# Patient Record
Sex: Female | Born: 1950
Health system: Southern US, Community
[De-identification: ages and names within clinical notes are randomized; demographics above are authoritative.]

## PROBLEM LIST (undated history)

## (undated) DIAGNOSIS — K635 Polyp of colon: Secondary | ICD-10-CM

## (undated) DIAGNOSIS — H269 Unspecified cataract: Secondary | ICD-10-CM

## (undated) DIAGNOSIS — M810 Age-related osteoporosis without current pathological fracture: Secondary | ICD-10-CM

## (undated) DIAGNOSIS — C801 Malignant (primary) neoplasm, unspecified: Secondary | ICD-10-CM

## (undated) DIAGNOSIS — J302 Other seasonal allergic rhinitis: Secondary | ICD-10-CM

## (undated) DIAGNOSIS — I10 Essential (primary) hypertension: Secondary | ICD-10-CM

## (undated) DIAGNOSIS — E785 Hyperlipidemia, unspecified: Secondary | ICD-10-CM

## (undated) HISTORY — DX: Malignant (primary) neoplasm, unspecified: C80.1

## (undated) HISTORY — DX: Hyperlipidemia, unspecified: E78.5

## (undated) HISTORY — DX: Essential (primary) hypertension: I10

## (undated) HISTORY — PX: BREAST BIOPSY: SHX20

## (undated) HISTORY — DX: Other seasonal allergic rhinitis: J30.2

## (undated) HISTORY — DX: Polyp of colon: K63.5

## (undated) HISTORY — DX: Unspecified cataract: H26.9

## (undated) HISTORY — DX: Age-related osteoporosis without current pathological fracture: M81.0

## (undated) HISTORY — PX: EYE SURGERY: SHX253

## (undated) HISTORY — PX: SKIN BIOPSY: SHX1

## (undated) HISTORY — PX: CATARACT EXTRACTION, BILATERAL: SHX1313

---

## 2013-07-21 ENCOUNTER — Encounter (HOSPITAL_COMMUNITY): Payer: Self-pay | Admitting: Emergency Medicine

## 2013-07-21 ENCOUNTER — Emergency Department (HOSPITAL_COMMUNITY)
Admission: EM | Admit: 2013-07-21 | Discharge: 2013-07-21 | Disposition: A | Discharge status: Home / Self Care | Payer: 59 | Source: Home / Self Care

## 2013-07-21 DIAGNOSIS — L255 Unspecified contact dermatitis due to plants, except food: Secondary | ICD-10-CM

## 2013-07-21 MED ORDER — TRIAMCINOLONE ACETONIDE 40 MG/ML IJ SUSP
INTRAMUSCULAR | Status: AC
  Filled 2013-07-21: qty 1

## 2013-07-21 MED ORDER — METHYLPREDNISOLONE 4 MG PO KIT: PACK | ORAL | Status: DC

## 2013-07-21 MED ORDER — TRIAMCINOLONE ACETONIDE 40 MG/ML IJ SUSP
40.0000 mg | Freq: Once | INTRAMUSCULAR | Status: AC
  Administered 2013-07-21: 40 mg via INTRAMUSCULAR

## 2013-07-21 MED ORDER — TRIAMCINOLONE ACETONIDE 0.1 % EX CREA: 1.0000 "application " | TOPICAL_CREAM | Freq: Two times a day (BID) | CUTANEOUS | Status: DC

## 2013-07-21 NOTE — ED Provider Notes (Signed)
CSN: [id number]     Arrival date & time 07/21/13  1735 History   First MD Initiated Contact with Patient 07/21/13 1828     Chief Complaint  Patient presents with  . Rash   (Consider location/radiation/quality/duration/timing/severity/associated sxs/prior Treatment) HPI Comments: As above, exposed to a poisonous plant most likely and now with red, itchy rash to the R arm and patchy areas to the L arm , abdomen and lesser to legs.    History reviewed. No pertinent past medical history. History reviewed. No pertinent past surgical history. No family history on file. History  Substance Use Topics  . Smoking status: Not on file  . Smokeless tobacco: Not on file  . Alcohol Use: Not on file   OB History   Grav Para Term Preterm Abortions TAB SAB Ect Mult Living                 Review of Systems  Constitutional: Negative for fever.  HENT: Negative.   Respiratory: Negative.  Negative for cough and shortness of breath.   Gastrointestinal: Negative.   Genitourinary: Negative.   Skin: Positive for rash.  Neurological: Negative.   All other systems reviewed and are negative.   Allergies  Review of patient's allergies indicates no known allergies.  Home Medications   Prior to Admission medications   Not on File   BP 161/83  Pulse 74  Temp(Src) 98.9 F (37.2 C) (Oral)  Resp 16  SpO2 96% Physical Exam  Nursing note and vitals reviewed. Constitutional: She is oriented to person, place, and time. She appears well-developed and well-nourished. No distress.  HENT:  Mouth/Throat: Oropharynx is clear and moist.  No rash to face  Eyes: Conjunctivae and EOM are normal.  Neck: Normal range of motion. Neck supple.  Cardiovascular: Normal rate.   Pulmonary/Chest: Effort normal. No respiratory distress.  Neurological: She is alert and oriented to person, place, and time. She exhibits normal muscle tone.  Skin: Skin is warm and dry. Rash noted.  Right arm with deep erythema,  papulovesicular lesions with minor swelling. A few patchy red lesions in the same character to the abdomen and other extremities.  Psychiatric: She has a normal mood and affect.    ED Course  Procedures (including critical care time) Labs Review Labs Reviewed - No data to display  Imaging Review No results found.   MDM   1. Rhus dermatitis      Kenalog 40 mg IM Medrol dose triamcinolone cr bid    Janne Napoleon, NP 07/21/13 1904

## 2013-07-21 NOTE — Discharge Instructions (Signed)
Contact Dermatitis Contact dermatitis is a reaction to certain substances that touch the skin. Contact dermatitis can be either irritant contact dermatitis or allergic contact dermatitis. Irritant contact dermatitis does not require previous exposure to the substance for a reaction to occur.Allergic contact dermatitis only occurs if you have been exposed to the substance before. Upon a repeat exposure, your body reacts to the substance.  CAUSES  Many substances can cause contact dermatitis. Irritant dermatitis is most commonly caused by repeated exposure to mildly irritating substances, such as:  Makeup.  Soaps.  Detergents.  Bleaches.  Acids.  Metal salts, such as nickel. Allergic contact dermatitis is most commonly caused by exposure to:  Poisonous plants.  Chemicals (deodorants, shampoos).  Jewelry.  Latex.  Neomycin in triple antibiotic cream.  Preservatives in products, including clothing. SYMPTOMS  The area of skin that is exposed may develop:  Dryness or flaking.  Redness.  Cracks.  Itching.  Pain or a burning sensation.  Blisters. With allergic contact dermatitis, there may also be swelling in areas such as the eyelids, mouth, or genitals.  DIAGNOSIS  Your caregiver can usually tell what the problem is by doing a physical exam. In cases where the cause is uncertain and an allergic contact dermatitis is suspected, a patch skin test may be performed to help determine the cause of your dermatitis. TREATMENT Treatment includes protecting the skin from further contact with the irritating substance by avoiding that substance if possible. Barrier creams, powders, and gloves may be helpful. Your caregiver may also recommend:  Steroid creams or ointments applied 2 times daily. For best results, soak the rash area in cool water for 20 minutes. Then apply the medicine. Cover the area with a plastic wrap. You can store the steroid cream in the refrigerator for a "chilly"  effect on your rash. That may decrease itching. Oral steroid medicines may be needed in more severe cases.  Antibiotics or antibacterial ointments if a skin infection is present.  Antihistamine lotion or an antihistamine taken by mouth to ease itching.  Lubricants to keep moisture in your skin.  Burow's solution to reduce redness and soreness or to dry a weeping rash. Mix one packet or tablet of solution in 2 cups cool water. Dip a clean washcloth in the mixture, wring it out a bit, and put it on the affected area. Leave the cloth in place for 30 minutes. Do this as often as possible throughout the day.  Taking several cornstarch or baking soda baths daily if the area is too large to cover with a washcloth. Harsh chemicals, such as alkalis or acids, can cause skin damage that is like a burn. You should flush your skin for 15 to 20 minutes with cold water after such an exposure. You should also seek immediate medical care after exposure. Bandages (dressings), antibiotics, and pain medicine may be needed for severely irritated skin.  HOME CARE INSTRUCTIONS  Avoid the substance that caused your reaction.  Keep the area of skin that is affected away from hot water, soap, sunlight, chemicals, acidic substances, or anything else that would irritate your skin.  Do not scratch the rash. Scratching may cause the rash to become infected.  You may take cool baths to help stop the itching.  Only take over-the-counter or prescription medicines as directed by your caregiver.  See your caregiver for follow-up care as directed to make sure your skin is healing properly. SEEK MEDICAL CARE IF:   Your condition is not better after 3  days of treatment.  You seem to be getting worse.  You see signs of infection such as swelling, tenderness, redness, soreness, or warmth in the affected area.  You have any problems related to your medicines. Document Released: 02/17/2000 Document Revised: 05/14/2011  Document Reviewed: 07/25/2010 [facility] Patient Information 2014 [place], [place].  Poison [employer] ivy is a inflammation of the skin (contact dermatitis) caused by touching the allergens on the leaves of the ivy plant following previous exposure to the plant. The rash usually appears 48 hours after exposure. The rash is usually bumps (papules) or blisters (vesicles) in a linear pattern. Depending on your own sensitivity, the rash may simply cause redness and itching, or it may also progress to blisters which may break open. These must be well cared for to prevent secondary bacterial (germ) infection, followed by scarring. Keep any open areas dry, clean, dressed, and covered with an antibacterial ointment if needed. The eyes may also get puffy. The puffiness is worst in the morning and gets better as the day progresses. This dermatitis usually heals without scarring, within 2 to 3 weeks without treatment. HOME CARE INSTRUCTIONS  Thoroughly wash with soap and water as soon as you have been exposed to poison ivy. You have about one half hour to remove the plant resin before it will cause the rash. This washing will destroy the oil or antigen on the skin that is causing, or will cause, the rash. Be sure to wash under your fingernails as any plant resin there will continue to spread the rash. Do not rub skin vigorously when washing affected area. Poison ivy cannot spread if no oil from the plant remains on your body. A rash that has progressed to weeping sores will not spread the rash unless you have not washed thoroughly. It is also important to wash any clothes you have been wearing as these may carry active allergens. The rash will return if you wear the unwashed clothing, even several days later. Avoidance of the plant in the future is the best measure. Poison ivy plant can be recognized by the number of leaves. Generally, poison ivy has three leaves with flowering branches on a single stem. Diphenhydramine  may be purchased over the counter and used as needed for itching. Do not drive with this medication if it makes you drowsy.Ask your caregiver about medication for children. SEEK MEDICAL CARE IF:  Open sores develop.  Redness spreads beyond area of rash.  You notice purulent (pus-like) discharge.  You have increased pain.  Other signs of infection develop (such as fever). Document Released: 02/17/2000 Document Revised: 05/14/2011 Document Reviewed: 01/05/2009 [facility] Patient Information 2014 [place], [place].

## 2013-07-21 NOTE — ED Notes (Signed)
Patient states was clearing out some brush last weekend A couple days later she started with a rash to her arm and multiple areas Areas have become red, itchy and inflamed especially her right arm

## 2013-07-23 NOTE — ED Provider Notes (Signed)
Medical screening examination/treatment/procedure(s) were performed by non-physician practitioner and as supervising physician I was immediately available for consultation/collaboration.  Philipp Deputy, M.D.  Harden Mo, MD 07/23/13 [phone]

## 2017-12-25 ENCOUNTER — Ambulatory Visit (INDEPENDENT_AMBULATORY_CARE_PROVIDER_SITE_OTHER): Payer: 59 | Admitting: Family Medicine

## 2017-12-25 ENCOUNTER — Encounter: Payer: Self-pay | Admitting: Family Medicine

## 2017-12-25 VITALS — BP 160/100 | HR 78 | Temp 97.8°F | Ht 67.0 in | Wt 142.0 lb

## 2017-12-25 DIAGNOSIS — Z8572 Personal history of non-Hodgkin lymphomas: Secondary | ICD-10-CM | POA: Insufficient documentation

## 2017-12-25 DIAGNOSIS — L720 Epidermal cyst: Secondary | ICD-10-CM

## 2017-12-25 DIAGNOSIS — I10 Essential (primary) hypertension: Secondary | ICD-10-CM

## 2017-12-25 DIAGNOSIS — Z0000 Encounter for general adult medical examination without abnormal findings: Secondary | ICD-10-CM | POA: Diagnosis not present

## 2017-12-25 DIAGNOSIS — C84A0 Cutaneous T-cell lymphoma, unspecified, unspecified site: Secondary | ICD-10-CM | POA: Insufficient documentation

## 2017-12-25 LAB — COMPREHENSIVE METABOLIC PANEL
ALK PHOS: 43 U/L (ref 39–117)
ALT: 11 U/L (ref 0–35)
AST: 16 U/L (ref 0–37)
Albumin: 4.8 g/dL (ref 3.5–5.2)
BUN: 8 mg/dL (ref 6–23)
CO2: 28 mEq/L (ref 19–32)
Calcium: 9.3 mg/dL (ref 8.4–10.5)
Chloride: 103 mEq/L (ref 96–112)
Creatinine, Ser: 0.7 mg/dL (ref 0.40–1.20)
GFR: 88.22 mL/min (ref >60.00)
Glucose, Bld: 105 mg/dL — ABNORMAL HIGH (ref 70–99)
POTASSIUM: 3.8 meq/L (ref 3.5–5.1)
SODIUM: 140 meq/L (ref 135–145)
TOTAL PROTEIN: 7.7 g/dL (ref 6.0–8.3)
Total Bilirubin: 0.5 mg/dL (ref 0.2–1.2)

## 2017-12-25 LAB — CBC WITH DIFFERENTIAL/PLATELET
BASOS PCT: 0.5 % (ref 0.0–3.0)
Basophils Absolute: 0 10*3/uL (ref 0.0–0.1)
Eosinophils Absolute: 0 10*3/uL (ref 0.0–0.7)
Eosinophils Relative: 0.2 % (ref 0.0–5.0)
HCT: 45.3 % (ref 36.0–46.0)
Hemoglobin: 15 g/dL (ref 12.0–15.0)
LYMPHS ABS: 1.2 10*3/uL (ref 0.7–4.0)
Lymphocytes Relative: 23.2 % (ref 12.0–46.0)
MCHC: 33.2 g/dL (ref 30.0–36.0)
MCV: 94.1 fl (ref 78.0–100.0)
MONO ABS: 0.3 10*3/uL (ref 0.1–1.0)
Monocytes Relative: 5.4 % (ref 3.0–12.0)
NEUTROS PCT: 70.7 % (ref 43.0–77.0)
Neutro Abs: 3.8 10*3/uL (ref 1.4–7.7)
Platelets: 324 10*3/uL (ref 150.0–400.0)
RBC: 4.82 Mil/uL (ref 3.87–5.11)
RDW: 13.9 % (ref 11.5–15.5)
WBC: 5.3 10*3/uL (ref 4.0–10.5)

## 2017-12-25 LAB — MICROALBUMIN / CREATININE URINE RATIO
Creatinine,U: 21.8 mg/dL
Microalb Creat Ratio: 3.2 mg/g (ref 0.0–30.0)

## 2017-12-25 LAB — LIPID PANEL
Cholesterol: 230 mg/dL — ABNORMAL HIGH (ref 0–200)
HDL: 65.8 mg/dL (ref >39.00)
LDL Cholesterol: 139 mg/dL — ABNORMAL HIGH (ref 0–99)
NONHDL: 163.79
Total CHOL/HDL Ratio: 3
Triglycerides: 122 mg/dL (ref 0.0–149.0)
VLDL: 24.4 mg/dL (ref 0.0–40.0)

## 2017-12-25 LAB — TSH: TSH: 1.51 u[IU]/mL (ref 0.35–4.50)

## 2017-12-25 MED ORDER — LISINOPRIL 20 MG PO TABS: 20.0000 mg | ORAL_TABLET | Freq: Every day | ORAL | 0 refills | Status: DC

## 2017-12-25 NOTE — Patient Instructions (Addendum)
Starting you on lisinopril 20mg  daily for your BP. See me back in one month.   Epidermal Cyst An epidermal cyst is a small, painless lump under your skin. It may be called an epidermal inclusion cyst or an infundibular cyst. The cyst contains a grayish-white, bad-smelling substance (keratin). It is important not to pop epidermal cysts yourself. These cysts are usually harmless (benign), but they can get infected. Symptoms of infection may include:  Redness.  Inflammation.  Tenderness.  Warmth.  Fever.  A grayish-white, bad-smelling substance draining from the cyst.  Pus draining from the cyst.  Follow these instructions at home:  Take over-the-counter and prescription medicines only as told by your doctor.  If you were prescribed an antibiotic, use it as told by your doctor. Do not stop using the antibiotic even if you start to feel better.  Keep the area around your cyst clean and dry.  Wear loose, dry clothing.  Do not try to pop your cyst.  Avoid touching your cyst.  Check your cyst every day for signs of infection.  Keep all follow-up visits as told by your doctor. This is important. How is this prevented?  Wear clean, dry, clothing.  Avoid wearing tight clothing.  Keep your skin clean and dry. Shower or take baths every day.  Wash your body with a benzoyl peroxide wash when you shower or bathe. Contact a health care provider if:  Your cyst has symptoms of infection.  Your condition is not improving or is getting worse.  You have a cyst that looks different from other cysts you have had.  You have a fever. Get help right away if:  Redness spreads from the cyst into the surrounding area. This information is not intended to replace advice given to you by your health care provider. Make sure you discuss any questions you have with your health care provider. Document Released: 03/29/2004 Document Revised: 10/19/2015 Document Reviewed: 12/22/2014 Elsevier  Interactive Patient Education  2018 [employer]. Hypertension Hypertension is another name for high blood pressure. High blood pressure forces your heart to work harder to pump blood. This can cause problems over time. There are two numbers in a blood pressure reading. There is a top number (systolic) over a bottom number (diastolic). It is best to have a blood pressure below 120/80. Healthy choices can help lower your blood pressure. You may need medicine to help lower your blood pressure if:  Your blood pressure cannot be lowered with healthy choices.  Your blood pressure is higher than 130/80.  Follow these instructions at home: Eating and drinking  If directed, follow the DASH eating plan. This diet includes: ? Filling half of your plate at each meal with fruits and vegetables. ? Filling one quarter of your plate at each meal with whole grains. Whole grains include whole wheat pasta, brown rice, and whole grain bread. ? Eating or drinking low-fat dairy products, such as skim milk or low-fat yogurt. ? Filling one quarter of your plate at each meal with low-fat (lean) proteins. Low-fat proteins include fish, skinless chicken, eggs, beans, and tofu. ? Avoiding fatty meat, cured and processed meat, or chicken with skin. ? Avoiding premade or processed food.  Eat less than 1,500 mg of salt (sodium) a day.  Limit alcohol use to no more than 1 drink a day for nonpregnant women and 2 drinks a day for men. One drink equals 12 oz of beer, 5 oz of wine, or 1 oz of hard liquor. Lifestyle  Work with your doctor to stay at a healthy weight or to lose weight. Ask your doctor what the best weight is for you.  Get at least 30 minutes of exercise that causes your heart to beat faster (aerobic exercise) most days of the week. This may include walking, swimming, or biking.  Get at least 30 minutes of exercise that strengthens your muscles (resistance exercise) at least 3 days a week. This may include  lifting weights or pilates.  Do not use any products that contain nicotine or tobacco. This includes cigarettes and e-cigarettes. If you need help quitting, ask your doctor.  Check your blood pressure at home as told by your doctor.  Keep all follow-up visits as told by your doctor. This is important. Medicines  Take over-the-counter and prescription medicines only as told by your doctor. Follow directions carefully.  Do not skip doses of blood pressure medicine. The medicine does not work as well if you skip doses. Skipping doses also puts you at risk for problems.  Ask your doctor about side effects or reactions to medicines that you should watch for. Contact a doctor if:  You think you are having a reaction to the medicine you are taking.  You have headaches that keep coming back (recurring).  You feel dizzy.  You have swelling in your ankles.  You have trouble with your vision. Get help right away if:  You get a very bad headache.  You start to feel confused.  You feel weak or numb.  You feel faint.  You get very bad pain in your: ? Chest. ? Belly (abdomen).  You throw up (vomit) more than once.  You have trouble breathing. Summary  Hypertension is another name for high blood pressure.  Making healthy choices can help lower blood pressure. If your blood pressure cannot be controlled with healthy choices, you may need to take medicine. This information is not intended to replace advice given to you by your health care provider. Make sure you discuss any questions you have with your health care provider. Document Released: 08/08/2007 Document Revised: 01/18/2016 Document Reviewed: 01/18/2016 Elsevier Interactive Patient Education  [employer].

## 2017-12-25 NOTE — Progress Notes (Signed)
Patient: Elizabeth Vincent MRN: [id number] DOB: [birth year] PCP: Orma Flaming, MD     Subjective:  Chief Complaint  Patient presents with  . Establish Care  . lump in left breast    HPI: The patient is a 67 y.o. female who presents today for annual exam. She denies any changes to past medical history. There have been no recent hospitalizations. They are following a well balanced diet and exercise plan. Weight has been stable.  She has concerns for a lump in her breast. Her mother had ovarian cancer.   Lump in her left breast: she is unsure when she first noticed it. Lump in on the medial-superior aspect of her left breast. It is not tender to touch and she thinks it has grown in size. No nipple issues.   Immunization History  Administered Date(s) Administered  . Influenza-Unspecified 11/14/2017   Colonoscopy: never had one  Mammogram: 2000 Pap smear: las one before 2000 Tdap: none  pnuemonia shots: none   Review of Systems  Constitutional: Negative for chills, fatigue and fever.  HENT: Negative for dental problem, ear pain, hearing loss and trouble swallowing.   Eyes: Negative for visual disturbance.  Respiratory: Negative for cough, chest tightness and shortness of breath.   Cardiovascular: Negative for chest pain, palpitations and leg swelling.  Gastrointestinal: Negative for abdominal pain, blood in stool, constipation, diarrhea and nausea.  Endocrine: Negative for cold intolerance, polydipsia, polyphagia and polyuria.  Genitourinary: Negative for dysuria and hematuria.  Musculoskeletal: Negative for arthralgias, back pain and neck pain.  Skin: Positive for color change. Negative for rash.       Mole on left lower leg Lump on left breast   Neurological: Negative for dizziness and headaches.  Psychiatric/Behavioral: Negative for dysphoric mood and sleep disturbance. The patient is nervous/anxious.     Allergies Patient has No Known Allergies.  Past Medical  History Patient  has a past medical history of Cancer ([place]).  Surgical History Patient  has a past surgical history that includes Eye surgery; Cataract extraction, bilateral; Skin biopsy; and Breast biopsy.  Family History Pateint's family history includes Alcohol abuse in her sister; Cancer in her maternal grandfather, maternal grandmother, mother, and paternal grandfather; Early death in her mother; Heart disease in her father and paternal grandmother; Stroke in her paternal grandfather.  Social History Patient  reports that she has never smoked. She has never used smokeless tobacco. She reports that she drinks alcohol. She reports that she does not use drugs.    Objective: Vitals:   12/25/17 1041 12/25/17 1051 12/25/17 1123  BP: (!) 146/98 (!) 154/102 (!) 160/100  Pulse: 78    Temp: 97.8 F (36.6 C)    TempSrc: Oral    SpO2: 99%    Weight: 142 lb (64.4 kg)    Height: 5\' 7"  (1.702 m)      Body mass index is 22.24 kg/m.  Physical Exam  Constitutional: She is oriented to person, place, and time. She appears well-developed and well-nourished.  HENT:  Right Ear: External ear normal.  Left Ear: External ear normal.  Tm occluded with cerumen bilaterally. Cobblestoning on posterior pharynx   Neck: Normal range of motion. Neck supple. No thyromegaly present.  Cardiovascular: Normal rate, regular rhythm and normal heart sounds.  No carotid bruits bilaterally   Pulmonary/Chest: Effort normal and breath sounds normal.  Abdominal: Soft. Bowel sounds are normal.  Lymphadenopathy:    She has no cervical adenopathy.  Neurological: She is alert and oriented to person,  place, and time.  Skin:  Epidermoid cyst on left inner,upper breast. About 1.5cm in diameter. Flesh colored with dimpled center that is black.   Psychiatric: She has a normal mood and affect. Her behavior is normal.  Vitals reviewed.  Depression screen [facility] 2/9 12/25/2017  Decreased Interest 0  Down, Depressed,  Hopeless 0  PHQ - 2 Score 0  Altered sleeping 0  Tired, decreased energy 0  Change in appetite 0  Feeling bad or failure about yourself  0  Trouble concentrating 0  Moving slowly or fidgety/restless 0  Suicidal thoughts 0  PHQ-9 Score 0  Difficult doing work/chores Not difficult at all   GAD 7 : Generalized Anxiety Score 12/25/2017  Nervous, Anxious, on Edge 1  Control/stop worrying 0  Worry too much - different things 0  Trouble relaxing 0  Restless 0  Easily annoyed or irritable 0  Afraid - awful might happen 1  Total GAD 7 Score 2  Anxiety Difficulty Not difficult at all   Verbal consent obtained. 25G needle used to open cyst and copious amounts of foul smelling, thick, white colored debris expressed. No bleeding and patient tolerated well.      Assessment/plan: 1. Annual physical exam Has not had medical care in awhile. Routine lab work today. Needs mmg and this information was given to her as well. Discussed immunizations and she will be willing to get her tdap and pcv 13 at next visit. Health maintenance issues discussed. Will see her back to continue to work on this in one month.  Patient counseling [x]    Nutrition: Stressed importance of moderation in sodium/caffeine intake, saturated fat and cholesterol, caloric balance, sufficient intake of fresh fruits, vegetables, fiber, calcium, iron, and 1 mg of folate supplement per day (for females capable of pregnancy).  [x]    Stressed the importance of regular exercise.   []    Substance Abuse: Discussed cessation/primary prevention of tobacco, alcohol, or other drug use; driving or other dangerous activities under the influence; availability of treatment for abuse.   [x]    Injury prevention: Discussed safety belts, safety helmets, smoke detector, smoking near bedding or upholstery.   [x]    Sexuality: Discussed sexually transmitted diseases, partner selection, use of condoms, avoidance of unintended pregnancy  and contraceptive  alternatives.  [x]    Dental health: Discussed importance of regular tooth brushing, flossing, and dental visits.  [x]    Health maintenance and immunizations reviewed. Please refer to Health maintenance section.    - Comprehensive metabolic panel - CBC with Differential/Platelet - Lipid panel - TSH  2. Essential hypertension Extremely elevated. Starting her on lisinopril 20mg  daily. Side effects of ACE-I discussed including dry cough and angioedema. She is to call me if feels like they have a dry cough and they are to call 911 or go to ER if any signs/symptoms of angioedema. Routine lab work today. Will need ekg at next visit for baseline. F/u in one month with me. Side effects of uncontrolled htn discussed including chf, afib, MI, CVA, vision issues to name a few.   - Microalbumin / creatinine urine ratio  3. Epidermoid cyst Expressed today. Discussed it will come back. Eradication is surgical excision of the sac. Would send to surgery to do this. She declines and is happy with just leaving it as long as she knows it's an epidermoid cyst. Did discuss they can get infected and gave her precautions for this. She is to let me know if she would like this removed.  Return in about 1 month (around 01/25/2018) for blood pressure .     Orma Flaming, MD [place]  12/25/2017

## 2017-12-26 ENCOUNTER — Encounter: Payer: Self-pay | Admitting: Family Medicine

## 2017-12-26 DIAGNOSIS — I10 Essential (primary) hypertension: Secondary | ICD-10-CM | POA: Insufficient documentation

## 2017-12-26 DIAGNOSIS — E785 Hyperlipidemia, unspecified: Secondary | ICD-10-CM | POA: Insufficient documentation

## 2018-01-29 ENCOUNTER — Encounter: Payer: Self-pay | Admitting: Family Medicine

## 2018-01-29 ENCOUNTER — Ambulatory Visit: Payer: 59 | Admitting: Family Medicine

## 2018-01-29 VITALS — BP 132/88 | HR 80 | Temp 98.1°F | Ht 67.0 in | Wt 140.4 lb

## 2018-01-29 DIAGNOSIS — E782 Mixed hyperlipidemia: Secondary | ICD-10-CM | POA: Diagnosis not present

## 2018-01-29 DIAGNOSIS — Z23 Encounter for immunization: Secondary | ICD-10-CM

## 2018-01-29 DIAGNOSIS — Z1159 Encounter for screening for other viral diseases: Secondary | ICD-10-CM | POA: Diagnosis not present

## 2018-01-29 DIAGNOSIS — I10 Essential (primary) hypertension: Secondary | ICD-10-CM

## 2018-01-29 LAB — LIPID PANEL
CHOLESTEROL: 239 mg/dL — AB (ref 0–200)
HDL: 72.9 mg/dL (ref >39.00)
LDL Cholesterol: 153 mg/dL — ABNORMAL HIGH (ref 0–99)
NonHDL: 166.38
Total CHOL/HDL Ratio: 3
Triglycerides: 65 mg/dL (ref 0.0–149.0)
VLDL: 13 mg/dL (ref 0.0–40.0)

## 2018-01-29 MED ORDER — LISINOPRIL 40 MG PO TABS: 40.0000 mg | ORAL_TABLET | Freq: Every day | ORAL | 3 refills | Status: DC

## 2018-01-29 NOTE — Patient Instructions (Signed)
Increasing your lisinopril to 40mg /day. I sent in a new prescription for you to take, but you can double up on your 20mg  pills. See you back in one month.   Checking fasting cholesterol today. If risk factor still quite elevated, would recommend we start medication.

## 2018-01-29 NOTE — Progress Notes (Signed)
Patient: Elizabeth Vincent MRN: [id number] DOB: March 07, 1950 PCP: Orma Flaming, MD     Subjective:  Chief Complaint  Patient presents with  . Hypertension    follow up    HPI: The patient is a 67 y.o. female who presents today for follow up on hypertension.   Hypertension: Here for follow up of hypertension.  Currently on lisinopril 20mg  . Home readings range from: does not take home reading/log. Takes medication as prescribed and denies any side effects. Exercise includes none. Weight has been stable. Denies any chest pain, headaches, shortness of breath, vision changes, swelling in lower extremities. Denies any side effects of medication.   Hyperlipidemia: her ASCVD risk factor is 15.6%. Discussing today.   Review of Systems  Constitutional: Negative for fatigue.  Respiratory: Negative for shortness of breath.   Cardiovascular: Negative for chest pain.  Gastrointestinal: Negative for abdominal pain and nausea.  Skin: Negative.   Neurological: Negative for dizziness and headaches.  Psychiatric/Behavioral: Negative for dysphoric mood and sleep disturbance. The patient is not nervous/anxious.     Allergies Patient has No Known Allergies.  Past Medical History Patient  has a past medical history of Cancer ([place]).  Surgical History Patient  has a past surgical history that includes Eye surgery; Cataract extraction, bilateral; Skin biopsy; and Breast biopsy.  Family History Pateint's family history includes Alcohol abuse in her sister; Cancer in her maternal grandfather, maternal grandmother, mother, and paternal grandfather; Early death in her mother; Heart disease in her father and paternal grandmother; Stroke in her paternal grandfather.  Social History Patient  reports that she has never smoked. She has never used smokeless tobacco. She reports that she drinks alcohol. She reports that she does not use drugs.    Objective: Vitals:   01/29/18 0912 01/29/18 0921  BP: (!)  136/92 132/88  Pulse: 80   Temp: 98.1 F (36.7 C)   TempSrc: Oral   SpO2: 98%   Weight: 140 lb 6.4 oz (63.7 kg)   Height: 5\' 7"  (1.702 m)     Body mass index is 21.99 kg/m. Repeat: 168/100  Physical Exam  Constitutional: She is oriented to person, place, and time. She appears well-developed and well-nourished.  Neck: Normal range of motion. Neck supple.  Cardiovascular: Normal rate, regular rhythm and normal heart sounds.  Pulmonary/Chest: Effort normal and breath sounds normal.  Abdominal: Soft. Bowel sounds are normal.  Neurological: She is alert and oriented to person, place, and time.  Skin: Skin is warm and dry.  Psychiatric: She has a normal mood and affect. Her behavior is normal.  Vitals reviewed.  Ekg: nsr with rate of 81    Assessment/plan: 1. Essential hypertension Above goal. Increasing her lisinopril to 40mg  daily. Asked that she get a cuff and start a home log with me as well. Really work on exercise. Her diet is good and weight is close to goal. Will see her back in one month for recheck with home log. Baseline EKG today wnl.  - EKG 12-Lead  2. Need for Tdap vaccination  - Tdap vaccine greater than or equal to 7yo IM  3. Mixed hyperlipidemia ASCVD risk is 15.6%. She was not fasting, so we are going to check again today as she is fasting and see if this changes her risk factor at all. If still elevated, we will start medication. She seems on board with this.  - Lipid panel  4. Encounter for hepatitis C screening test for low risk patient  - Hepatitis C  antibody   Return in about 1 month (around 02/28/2018) for blood pressure .   Orma Flaming, MD [facility]   01/29/2018

## 2018-01-31 LAB — HEPATITIS C ANTIBODY
HEP C AB: NONREACTIVE
SIGNAL TO CUT-OFF: 0.01 (ref <1.00)

## 2018-02-28 ENCOUNTER — Encounter: Payer: Self-pay | Admitting: Family Medicine

## 2018-02-28 ENCOUNTER — Ambulatory Visit: Payer: 59 | Admitting: Family Medicine

## 2018-02-28 VITALS — BP 148/80 | HR 68 | Temp 98.0°F | Ht 67.0 in | Wt 140.0 lb

## 2018-02-28 DIAGNOSIS — I10 Essential (primary) hypertension: Secondary | ICD-10-CM | POA: Diagnosis not present

## 2018-02-28 DIAGNOSIS — E782 Mixed hyperlipidemia: Secondary | ICD-10-CM | POA: Diagnosis not present

## 2018-02-28 NOTE — Progress Notes (Signed)
Patient: Elizabeth Vincent MRN: [id number] DOB: [birth year] PCP: Orma Flaming, MD     Subjective:  Chief Complaint  Patient presents with  . Hypertension    HPI: The patient is a 67 y.o. female who presents today for follow up on hypertension.   Hypertension: Here for follow up of hypertension.  Currently on lisinopril 40mg   We increased her dosage on her last visit. She does not take her blood pressure at home.  Takes medication as prescribed and denies any side effects. Exercise includes nothing. Weight has been stable. Denies any chest pain, headaches, shortness of breath, vision changes, swelling in lower extremities.    Review of Systems  Constitutional: Negative for fatigue.  Respiratory: Negative for shortness of breath.   Cardiovascular: Negative for chest pain.  Gastrointestinal: Negative for abdominal pain and nausea.  Musculoskeletal: Negative for back pain and neck pain.  Neurological: Negative for dizziness and headaches.    Allergies Patient has No Known Allergies.  Past Medical History Patient  has a past medical history of Cancer ([place]).  Surgical History Patient  has a past surgical history that includes Eye surgery; Cataract extraction, bilateral; Skin biopsy; and Breast biopsy.  Family History Pateint's family history includes Alcohol abuse in her sister; Cancer in her maternal grandfather, maternal grandmother, mother, and paternal grandfather; Early death in her mother; Heart disease in her father and paternal grandmother; Stroke in her paternal grandfather.  Social History Patient  reports that she has never smoked. She has never used smokeless tobacco. She reports current alcohol use. She reports that she does not use drugs.    Objective: Vitals:   02/28/18 0758 02/28/18 0803 02/28/18 0818  BP: (!) 136/100 (!) 134/98 (!) 148/80  Pulse: 68    Temp: 98 F (36.7 C)    TempSrc: Oral    SpO2: 98%    Weight: 140 lb (63.5 kg)    Height: 5\' 7"  (1.702  m)      Body mass index is 21.93 kg/m.  Physical Exam Vitals signs reviewed.  Constitutional:      Appearance: Normal appearance. She is normal weight.  Neck:     Musculoskeletal: Normal range of motion and neck supple.  Cardiovascular:     Rate and Rhythm: Normal rate and regular rhythm.     Heart sounds: Normal heart sounds.  Pulmonary:     Effort: Pulmonary effort is normal.     Breath sounds: Normal breath sounds.  Abdominal:     General: Abdomen is flat. Bowel sounds are normal.     Palpations: Abdomen is soft.  Neurological:     General: No focal deficit present.     Mental Status: She is alert and oriented to person, place, and time.        Assessment/plan: 1. Essential hypertension She has not taken her medication today.  Will not adjust as she likely will be to goal with her BP as it's not too above goal today on repeat by myself. Will have her come back for nurse visit to check BP after she takes medication and then see me in 6 months for routine f/u and labs.   2. Mixed hyperlipidemia She declines any medication at this time. ASCVD risk is 15.6% and she understands risks. Will continue with healthy diet/exercise and blood pressure control.   -declines pneumonia shot. Referred for mammogram and has not gotten done.    Return in about 6 months (around 08/30/2018) for nurse visit in 2 weeks. take med! see  me in 6 months .    Orma Flaming, MD [place]   02/28/2018

## 2018-03-12 ENCOUNTER — Ambulatory Visit (INDEPENDENT_AMBULATORY_CARE_PROVIDER_SITE_OTHER): Payer: Managed Care, Other (non HMO)

## 2018-03-12 VITALS — BP 130/86 | HR 56

## 2018-03-12 DIAGNOSIS — I10 Essential (primary) hypertension: Secondary | ICD-10-CM | POA: Diagnosis not present

## 2018-03-12 NOTE — Progress Notes (Signed)
Blood pressure to goal. F/u in 6 months for regular appointment.

## 2018-03-12 NOTE — Progress Notes (Signed)
Pt here for bp check today.  She has not been monitoring her bp at home-but denies any chest pain, SOB, dizziness or headaches and feels fine otherwise.  She is currently taking 40 mg of Lisinopril daily.  She is tolerating the medication well.  Bp today was: 130/86.  Pt has her next appt scheduled for 08/27/18,  Instructed pt to keep this appt and follow up sooner prn.

## 2018-08-27 ENCOUNTER — Ambulatory Visit: Payer: 59 | Admitting: Family Medicine

## 2018-10-15 ENCOUNTER — Other Ambulatory Visit: Payer: Self-pay

## 2018-10-15 ENCOUNTER — Ambulatory Visit (INDEPENDENT_AMBULATORY_CARE_PROVIDER_SITE_OTHER): Payer: PPO | Admitting: Family Medicine

## 2018-10-15 ENCOUNTER — Encounter: Payer: Self-pay | Admitting: Family Medicine

## 2018-10-15 VITALS — BP 160/88 | HR 85 | Temp 97.2°F | Ht 67.0 in | Wt 135.0 lb

## 2018-10-15 DIAGNOSIS — Z1211 Encounter for screening for malignant neoplasm of colon: Secondary | ICD-10-CM

## 2018-10-15 DIAGNOSIS — I10 Essential (primary) hypertension: Secondary | ICD-10-CM

## 2018-10-15 DIAGNOSIS — Z23 Encounter for immunization: Secondary | ICD-10-CM

## 2018-10-15 LAB — COMPREHENSIVE METABOLIC PANEL
ALT: 9 U/L (ref 0–35)
AST: 13 U/L (ref 0–37)
Albumin: 4.8 g/dL (ref 3.5–5.2)
Alkaline Phosphatase: 34 U/L — ABNORMAL LOW (ref 39–117)
BUN: 11 mg/dL (ref 6–23)
CO2: 28 mEq/L (ref 19–32)
Calcium: 9.6 mg/dL (ref 8.4–10.5)
Chloride: 102 mEq/L (ref 96–112)
Creatinine, Ser: 0.77 mg/dL (ref 0.40–1.20)
GFR: 74.18 mL/min (ref >60.00)
Glucose, Bld: 104 mg/dL — ABNORMAL HIGH (ref 70–99)
Potassium: 4.6 mEq/L (ref 3.5–5.1)
Sodium: 139 mEq/L (ref 135–145)
Total Bilirubin: 0.5 mg/dL (ref 0.2–1.2)
Total Protein: 6.8 g/dL (ref 6.0–8.3)

## 2018-10-15 LAB — CBC WITH DIFFERENTIAL/PLATELET
Basophils Absolute: 0 10*3/uL (ref 0.0–0.1)
Basophils Relative: 0.7 % (ref 0.0–3.0)
Eosinophils Absolute: 0 10*3/uL (ref 0.0–0.7)
Eosinophils Relative: 0.1 % (ref 0.0–5.0)
HCT: 41.7 % (ref 36.0–46.0)
Hemoglobin: 13.5 g/dL (ref 12.0–15.0)
Lymphocytes Relative: 26.3 % (ref 12.0–46.0)
Lymphs Abs: 1.2 10*3/uL (ref 0.7–4.0)
MCHC: 32.4 g/dL (ref 30.0–36.0)
MCV: 95.9 fl (ref 78.0–100.0)
Monocytes Absolute: 0.4 10*3/uL (ref 0.1–1.0)
Monocytes Relative: 7.7 % (ref 3.0–12.0)
Neutro Abs: 3 10*3/uL (ref 1.4–7.7)
Neutrophils Relative %: 65.2 % (ref 43.0–77.0)
Platelets: 298 10*3/uL (ref 150.0–400.0)
RBC: 4.34 Mil/uL (ref 3.87–5.11)
RDW: 14.1 % (ref 11.5–15.5)
WBC: 4.7 10*3/uL (ref 4.0–10.5)

## 2018-10-15 MED ORDER — AMLODIPINE BESYLATE 5 MG PO TABS: 5.0000 mg | ORAL_TABLET | Freq: Every day | ORAL | 0 refills | Status: DC

## 2018-10-15 NOTE — Patient Instructions (Signed)
Adding on new blood pressure drug call amlodipine. You take it once in the AM and can take with the lisinopril. Will see you back in one month for recheck.    Colorectal Cancer Screening  Colorectal cancer screening is a group of tests that are used to check for colorectal cancer before symptoms develop. Colorectal refers to the colon and rectum. The colon and rectum are located at the end of the digestive tract and carry bowel movements out of the body. Who should have screening? All adults starting at age 2 until age 67 should have screening. Your health care provider may recommend screening at age 45. You will have tests every 1-10 years, depending on your results and the type of screening test. You may have screening tests starting at an earlier age, or more frequently than other people, if you have any of the following risk factors:  A personal or family history of colorectal cancer or abnormal growths (polyps).  Inflammatory bowel disease, such as ulcerative colitis or Crohn's disease.  A history of having radiation treatment to the abdomen or pelvic area for cancer.  Colorectal cancer symptoms, such as changes in bowel habits or blood in your stool.  A type of colon cancer syndrome that is passed from parent to child (hereditary), such as: ? Lynch syndrome. ? Familial adenomatous polyposis. ? Turcot syndrome. ? Peutz-Jeghers syndrome. Screening recommendations for adults who are 22-22 years old vary depending on health. How is screening done? There are several types of colorectal screening tests. You may have one or more of the following:  Guaiac-based fecal occult blood testing. For this test, a stool (feces) sample is checked for hidden (occult) blood, which could be a sign of colorectal cancer.  Fecal immunochemical test (FIT). For this test, a stool sample is checked for blood, which could be a sign of colorectal cancer.  Stool DNA test. For this test, a stool sample is  checked for blood and changes in DNA that could lead to colorectal cancer.  Sigmoidoscopy. During this test, a thin, flexible tube with a camera on the end (sigmoidoscope) is used to examine the rectum and the lower colon.  Colonoscopy. During this test, a long, flexible tube with a camera on the end (colonoscope) is used to examine the entire colon and rectum. With a colonoscopy, it is possible to take a sample of tissue (biopsy) and remove small polyps during the test.  Virtual colonoscopy. Instead of a colonoscope, this type of colonoscopy uses X-rays (CT scan) and computers to produce images of the colon and rectum. What are the benefits of screening? Screening reduces your risk for colorectal cancer and can help identify cancer at an early stage, when the cancer can be removed or treated more easily. It is common for polyps to form in the lining of the colon, especially as you age. These polyps may be cancerous or become cancerous over time. Screening can identify these polyps. What are the risks of screening? Each screening test may have different risks.  Stool sample tests have fewer risks than other types of screening tests. However, you may need more tests to confirm results from a stool sample test.  Screening tests that involve X-rays expose you to low levels of radiation, which may slightly increase your cancer risk. The benefit of detecting cancer outweighs the slight increase in risk.  Screening tests such as sigmoidoscopy and colonoscopy may place you at risk for bleeding, intestinal damage, infection, or a reaction to medicines given  during the exam. Talk with your health care provider to understand your risk for colorectal cancer and to make a screening plan that is right for you. Questions to ask your health care provider  When should I start colorectal cancer screening?  What is my risk for colorectal cancer?  How often do I need screening?  Which screening tests do I  need?  How do I get my test results?  What do my results mean? Where to find more information Learn more about colorectal cancer screening from:  The [employer]: www.cancer.org  The [employer]: www.cancer.gov Summary  Colorectal cancer screening is a group of tests used to check for colorectal cancer before symptoms develop.  Screening reduces your risk for colorectal cancer and can help identify cancer at an early stage, when the cancer can be removed or treated more easily.  All adults starting at age 5 until age 18 should have screening. Your health care provider may recommend screening at age 66.  You may have screening tests starting at an earlier age, or more frequently than other people, if you have certain risk factors.  Talk with your health care provider to understand your risk for colorectal cancer and to make a screening plan that is right for you. This information is not intended to replace advice given to you by your health care provider. Make sure you discuss any questions you have with your health care provider. Document Released: 08/09/2009 Document Revised: 06/11/2018 Document Reviewed: 11/21/2016 Elsevier Patient Education  2020 [employer].

## 2018-10-15 NOTE — Progress Notes (Signed)
Patient: Elizabeth Vincent MRN: [id number] DOB: Aug 05, 1950 PCP: Orma Flaming, MD     Subjective:  Chief Complaint  Patient presents with  . Hypertension    6 month follow up    HPI: The patient is a 68 y.o. female who presents today for 6 month follow up of hypertension.  BP continues to be elevated despite eating a heart healthy diet and moderated exercise.  Hypertension: Here for follow up of hypertension.  Currently on lisinopril 40mg /day . Does not take her BP at home. Takes medication as prescribed and denies any side effects. Exercise includes walking. Weight has been stable. Denies any chest pain, headaches, shortness of breath, vision changes, swelling in lower extremities.   Review of Systems  Constitutional: Negative for chills, fatigue and fever.  HENT: Negative.   Eyes: Positive for visual disturbance.  Respiratory: Negative for cough and shortness of breath.   Cardiovascular: Negative.  Negative for chest pain.  Gastrointestinal: Negative for abdominal pain, diarrhea, nausea and vomiting.  Endocrine: Negative for polyphagia and polyuria.  Genitourinary: Negative.   Musculoskeletal: Negative for back pain, myalgias and neck pain.  Skin: Negative.   Neurological: Negative for dizziness and headaches.  Psychiatric/Behavioral: Negative for dysphoric mood and sleep disturbance. The patient is not nervous/anxious.     Allergies Patient has No Known Allergies.  Past Medical History Patient  has a past medical history of Cancer ([place]).  Surgical History Patient  has a past surgical history that includes Eye surgery; Cataract extraction, bilateral; Skin biopsy; and Breast biopsy.  Family History Pateint's family history includes Alcohol abuse in her sister; Cancer in her maternal grandfather, maternal grandmother, mother, and paternal grandfather; Early death in her mother; Heart disease in her father and paternal grandmother; Stroke in her paternal  grandfather.  Social History Patient  reports that she has never smoked. She has never used smokeless tobacco. She reports current alcohol use. She reports that she does not use drugs.    Objective: Vitals:   10/15/18 1007 10/15/18 1016 10/15/18 1037  BP: (!) 160/98 (!) 148/90 (!) 160/88  Pulse: 85    Temp: (!) 97.2 F (36.2 C)    TempSrc: Temporal    SpO2: 97%    Weight: 135 lb (61.2 kg)    Height: 5\' 7"  (1.702 m)      Body mass index is 21.14 kg/m.  Physical Exam Vitals signs reviewed.  Constitutional:      Appearance: Normal appearance. She is well-developed and normal weight.  HENT:     Head: Normocephalic and atraumatic.     Right Ear: Tympanic membrane, ear canal and external ear normal.     Left Ear: Tympanic membrane, ear canal and external ear normal.     Nose: Nose normal.     Mouth/Throat:     Mouth: Mucous membranes are moist.  Eyes:     Extraocular Movements: Extraocular movements intact.     Conjunctiva/sclera: Conjunctivae normal.     Pupils: Pupils are equal, round, and reactive to light.  Neck:     Musculoskeletal: Normal range of motion and neck supple.     Thyroid: No thyromegaly.  Cardiovascular:     Rate and Rhythm: Normal rate and regular rhythm.     Heart sounds: Normal heart sounds. No murmur.  Pulmonary:     Effort: Pulmonary effort is normal.     Breath sounds: Normal breath sounds.  Abdominal:     General: Abdomen is flat. Bowel sounds are normal. There is no  distension.     Palpations: Abdomen is soft.     Tenderness: There is no abdominal tenderness.  Lymphadenopathy:     Cervical: No cervical adenopathy.  Skin:    General: Skin is warm and dry.     Capillary Refill: Capillary refill takes less than 2 seconds.     Findings: No rash.  Neurological:     General: No focal deficit present.     Mental Status: She is alert and oriented to person, place, and time.     Cranial Nerves: No cranial nerve deficit.     Coordination:  Coordination normal.     Deep Tendon Reflexes: Reflexes normal.  Psychiatric:        Behavior: Behavior normal.        Assessment/plan: 1. Essential hypertension Blood pressure is not to goal. Continue current anti-hypertensive medication and adding on NEW drug-norvasc 5mg . Discussed how to take medication and side effects. Routine lab work will be done today. Recommended routine exercise and healthy diet including DASH diet and mediterranean diet. Also want her to get a BP cuff and keep a home log for me. F/u in 1 month for recheck.   - CBC with Differential/Platelet - Comprehensive metabolic panel  2. Need for vaccination against Streptococcus pneumoniae  - Pneumococcal polysaccharide vaccine 23-valent greater than or equal to 2yo subcutaneous/IM  3. Screening for colon cancer  - Cologuard  -still needs mmg and DEXA. Will discuss at annual.     Return in about 1 month (around 11/15/2018) for blood pressure recheck .   Orma Flaming, MD [place]   10/15/2018

## 2018-10-30 DIAGNOSIS — Z1211 Encounter for screening for malignant neoplasm of colon: Secondary | ICD-10-CM | POA: Diagnosis not present

## 2018-11-05 LAB — COLOGUARD: Cologuard: POSITIVE — AB

## 2018-11-06 ENCOUNTER — Telehealth: Payer: Self-pay

## 2018-11-06 ENCOUNTER — Telehealth: Payer: Self-pay | Admitting: Family Medicine

## 2018-11-06 ENCOUNTER — Encounter: Payer: Self-pay | Admitting: Gastroenterology

## 2018-11-06 ENCOUNTER — Other Ambulatory Visit: Payer: Self-pay

## 2018-11-06 DIAGNOSIS — R195 Other fecal abnormalities: Secondary | ICD-10-CM

## 2018-11-06 NOTE — Progress Notes (Signed)
Urgent referral placed to [facility] for + cologuard test

## 2018-11-06 NOTE — Telephone Encounter (Signed)
Copied from [place] [id number]. Topic: General - Other >> Nov 06, 2018 12:28 PM Keene Breath wrote: Reason for CRM: Called to confirm that the office received a report of an abnormal cologard test for the patient.  Please call to confirm receipt.  CB# [phone], Order DJ:[id number]

## 2018-11-06 NOTE — Telephone Encounter (Signed)
Spoke to patient and advised of her positive cologuard results.  She is very upset and distressed about this.  Advised that we are placing urgent referral to GI for colonoscopy.  Patient verbalized understanding.

## 2018-11-06 NOTE — Telephone Encounter (Signed)
Noted and agree. Results received later in the day.  Orma Flaming, MD [place]

## 2018-11-06 NOTE — Telephone Encounter (Signed)
I have not received this yet

## 2018-11-14 ENCOUNTER — Encounter: Payer: Self-pay | Admitting: Family Medicine

## 2018-11-14 ENCOUNTER — Ambulatory Visit (INDEPENDENT_AMBULATORY_CARE_PROVIDER_SITE_OTHER): Payer: PPO | Admitting: Family Medicine

## 2018-11-14 ENCOUNTER — Other Ambulatory Visit: Payer: Self-pay

## 2018-11-14 VITALS — BP 132/80 | HR 86 | Temp 96.2°F | Wt 133.0 lb

## 2018-11-14 DIAGNOSIS — R195 Other fecal abnormalities: Secondary | ICD-10-CM | POA: Insufficient documentation

## 2018-11-14 DIAGNOSIS — I10 Essential (primary) hypertension: Secondary | ICD-10-CM | POA: Diagnosis not present

## 2018-11-14 NOTE — Progress Notes (Signed)
Patient: Elizabeth Vincent MRN: [id number] DOB: 1950/05/19 PCP: Orma Flaming, MD     Subjective:  Chief Complaint  Patient presents with  . Hypertension    HPI: The patient is a 68 y.o. female who presents today for hypertension follow up.  At her last visit she was above goal and we added on 5mg  of norvasc to her 40mg  of lisinopril. I also asked her to get a cuff and keep a log for me. She did get a wrist cuff. She said yesterday it was 137/73. Ranges 135-140/73-75. No side effects with new medication. Taking her lisinopril as well.    She is distraught as her cologaurd was positive and she has appointment with GI on 10/6 for her cscope.   Review of Systems  Constitutional: Negative for chills, fatigue, fever and unexpected weight change.  HENT: Negative for dental problem, ear pain, hearing loss and trouble swallowing.   Eyes: Negative for visual disturbance.  Respiratory: Negative for cough, chest tightness and shortness of breath.   Cardiovascular: Negative for chest pain, palpitations and leg swelling.  Gastrointestinal: Negative for abdominal pain, blood in stool, diarrhea, nausea and vomiting.  Endocrine: Negative for cold intolerance, polydipsia, polyphagia and polyuria.  Genitourinary: Negative for dysuria and hematuria.  Musculoskeletal: Negative for arthralgias.  Skin: Negative for rash.  Neurological: Negative for dizziness and headaches.  Psychiatric/Behavioral: Negative for dysphoric mood and sleep disturbance. The patient is not nervous/anxious.     Allergies Patient has No Known Allergies.  Past Medical History Patient  has a past medical history of Cancer ([place]).  Surgical History Patient  has a past surgical history that includes Eye surgery; Cataract extraction, bilateral; Skin biopsy; and Breast biopsy.  Family History Pateint's family history includes Alcohol abuse in her sister; Cancer in her maternal grandfather, maternal grandmother, mother, and  paternal grandfather; Early death in her mother; Heart disease in her father and paternal grandmother; Stroke in her paternal grandfather.  Social History Patient  reports that she has never smoked. She has never used smokeless tobacco. She reports current alcohol use. She reports that she does not use drugs.    Objective: Vitals:   11/14/18 0943 11/14/18 1000 11/14/18 1011  BP: (!) 143/100 (!) 158/82 132/80  Pulse: 86    Temp: (!) 96.2 F (35.7 C)    SpO2: 98%    Weight: 133 lb (60.3 kg)      Body mass index is 20.83 kg/m.  Physical Exam Vitals signs reviewed.  Constitutional:      Appearance: Normal appearance. She is well-developed and normal weight.  HENT:     Head: Normocephalic and atraumatic.     Right Ear: External ear normal.     Left Ear: External ear normal.  Eyes:     Conjunctiva/sclera: Conjunctivae normal.     Pupils: Pupils are equal, round, and reactive to light.  Neck:     Musculoskeletal: Normal range of motion and neck supple.     Thyroid: No thyromegaly.  Cardiovascular:     Rate and Rhythm: Normal rate and regular rhythm.     Heart sounds: Normal heart sounds. No murmur.  Pulmonary:     Effort: Pulmonary effort is normal.     Breath sounds: Normal breath sounds.  Abdominal:     General: Bowel sounds are normal. There is no distension.     Palpations: Abdomen is soft.     Tenderness: There is no abdominal tenderness.  Lymphadenopathy:     Cervical: No cervical adenopathy.  Skin:    General: Skin is warm and dry.     Findings: No rash.  Neurological:     General: No focal deficit present.     Mental Status: She is alert and oriented to person, place, and time.     Cranial Nerves: No cranial nerve deficit.     Coordination: Coordination normal.     Deep Tendon Reflexes: Reflexes normal.  Psychiatric:        Mood and Affect: Mood normal.        Behavior: Behavior normal.        Assessment/plan: 1. Essential hypertension To goal on  repeat. I think she has some anxiety when coming in here. Continue log at home. F/u in 6 months with fasting labs/routine appointment. Continue DASH diet and exercise.   2. Positive colorectal cancer screening using Cologuard test Set up for cscope.    Return in about 6 months (around 05/14/2019).    Orma Flaming, MD [place]  11/14/2018

## 2018-11-25 ENCOUNTER — Other Ambulatory Visit: Payer: Self-pay

## 2018-11-25 ENCOUNTER — Encounter: Payer: Self-pay | Admitting: Gastroenterology

## 2018-11-25 ENCOUNTER — Ambulatory Visit (AMBULATORY_SURGERY_CENTER): Payer: Self-pay

## 2018-11-25 VITALS — Temp 96.6°F | Ht 67.0 in | Wt 134.6 lb

## 2018-11-25 DIAGNOSIS — R195 Other fecal abnormalities: Secondary | ICD-10-CM

## 2018-11-25 NOTE — Progress Notes (Signed)
Denies allergies to eggs or soy products. Denies complication of anesthesia or sedation. Denies use of weight loss medication. Denies use of O2.   Emmi instructions given for colonoscopy.  

## 2018-12-08 ENCOUNTER — Telehealth: Payer: Self-pay | Admitting: Gastroenterology

## 2018-12-08 NOTE — Telephone Encounter (Signed)

## 2018-12-09 ENCOUNTER — Ambulatory Visit (AMBULATORY_SURGERY_CENTER): Payer: PPO | Admitting: Gastroenterology

## 2018-12-09 ENCOUNTER — Encounter: Payer: Self-pay | Admitting: Gastroenterology

## 2018-12-09 ENCOUNTER — Other Ambulatory Visit: Payer: Self-pay

## 2018-12-09 ENCOUNTER — Other Ambulatory Visit: Payer: Self-pay | Admitting: Gastroenterology

## 2018-12-09 VITALS — BP 130/75 | HR 61 | Temp 97.8°F | Resp 13 | Ht 67.0 in | Wt 134.6 lb

## 2018-12-09 DIAGNOSIS — K621 Rectal polyp: Secondary | ICD-10-CM

## 2018-12-09 DIAGNOSIS — D128 Benign neoplasm of rectum: Secondary | ICD-10-CM

## 2018-12-09 DIAGNOSIS — D122 Benign neoplasm of ascending colon: Secondary | ICD-10-CM

## 2018-12-09 DIAGNOSIS — R195 Other fecal abnormalities: Secondary | ICD-10-CM | POA: Diagnosis not present

## 2018-12-09 DIAGNOSIS — D129 Benign neoplasm of anus and anal canal: Secondary | ICD-10-CM

## 2018-12-09 DIAGNOSIS — K635 Polyp of colon: Secondary | ICD-10-CM | POA: Diagnosis not present

## 2018-12-09 MED ORDER — SODIUM CHLORIDE 0.9 % IV SOLN: 500.0000 mL | Freq: Once | INTRAVENOUS | Status: DC

## 2018-12-09 NOTE — Patient Instructions (Signed)
Handouts given for polyps, diverticulosis, high fiber diet and hemorrhoids.  YOU HAD AN ENDOSCOPIC PROCEDURE TODAY AT THE [facility]:   Refer to the procedure report that was given to you for any specific questions about what was found during the examination.  If the procedure report does not answer your questions, please call your gastroenterologist to clarify.  If you requested that your care partner not be given the details of your procedure findings, then the procedure report has been included in a sealed envelope for you to review at your convenience later.  YOU SHOULD EXPECT: Some feelings of bloating in the abdomen. Passage of more gas than usual.  Walking can help get rid of the air that was put into your GI tract during the procedure and reduce the bloating. If you had a lower endoscopy (such as a colonoscopy or flexible sigmoidoscopy) you may notice spotting of blood in your stool or on the toilet paper. If you underwent a bowel prep for your procedure, you may not have a normal bowel movement for a few days.  Please Note:  You might notice some irritation and congestion in your nose or some drainage.  This is from the oxygen used during your procedure.  There is no need for concern and it should clear up in a day or so.  SYMPTOMS TO REPORT IMMEDIATELY:   Following lower endoscopy (colonoscopy or flexible sigmoidoscopy):  Excessive amounts of blood in the stool  Significant tenderness or worsening of abdominal pains  Swelling of the abdomen that is new, acute  Fever of 100F or higher  For urgent or emergent issues, a gastroenterologist can be reached at any hour by calling [phone].   DIET:  We do recommend a small meal at first, but then you may proceed to your regular diet.  Drink plenty of fluids but you should avoid alcoholic beverages for 24 hours.  ACTIVITY:  You should plan to take it easy for the rest of today and you should NOT DRIVE or use heavy  machinery until tomorrow (because of the sedation medicines used during the test).    FOLLOW UP: Our staff will call the number listed on your records 48-72 hours following your procedure to check on you and address any questions or concerns that you may have regarding the information given to you following your procedure. If we do not reach you, we will leave a message.  We will attempt to reach you two times.  During this call, we will ask if you have developed any symptoms of COVID 19. If you develop any symptoms (ie: fever, flu-like symptoms, shortness of breath, cough etc.) before then, please call [phone].  If you test positive for Covid 19 in the 2 weeks post procedure, please call and report this information to us.    If any biopsies were taken you will be contacted by phone or by letter within the next 1-3 weeks.  Please call us at [phone] if you have not heard about the biopsies in 3 weeks.    SIGNATURES/CONFIDENTIALITY: You and/or your care partner have signed paperwork which will be entered into your electronic medical record.  These signatures attest to the fact that that the information above on your After Visit Summary has been reviewed and is understood.  Full responsibility of the confidentiality of this discharge information lies with you and/or your care-partner. 

## 2018-12-09 NOTE — Op Note (Signed)
[place] Patient Name: Elizabeth Vincent Procedure Date: 12/09/2018 8:30 AM MRN: [id number] Endoscopist: Thornton Park MD, MD Age: 68 Referring MD:  Date of Birth: 10/14/1950 Gender: Female Account #: [id number] Procedure:                Colonoscopy Indications:              Positive Cologuard test 10/30/18                           No prior colon cancer screening Medicines:                See the Anesthesia note for documentation of the                            administered medications Procedure:                Pre-Anesthesia Assessment:                           - Prior to the procedure, a History and Physical                            was performed, and patient medications and                            allergies were reviewed. The patient's tolerance of                            previous anesthesia was also reviewed. The risks                            and benefits of the procedure and the sedation                            options and risks were discussed with the patient.                            All questions were answered, and informed consent                            was obtained. Prior Anticoagulants: The patient has                            taken no previous anticoagulant or antiplatelet                            agents. ASA Grade Assessment: II - A patient with                            mild systemic disease. After reviewing the risks                            and benefits, the patient was deemed in  satisfactory condition to undergo the procedure.                           After obtaining informed consent, the colonoscope                            was passed under direct vision. Throughout the                            procedure, the patient's blood pressure, pulse, and                            oxygen saturations were monitored continuously. The                            LOANER 0255 was introduced through the anus  and                            advanced to the the terminal ileum, with                            identification of the appendiceal orifice and IC                            valve. A second forward view of the right colon was                            performed. The colonoscopy was performed with                            difficulty due to a redundant colon, significant                            looping and a tortuous colon. Successful completion                            of the procedure was aided by applying abdominal                            pressure. The patient tolerated the procedure well.                            The quality of the bowel preparation was good. The                            terminal ileum, ileocecal valve, appendiceal                            orifice, and rectum were photographed. Scope In: 8:41:14 AM Scope Out: 9:10:08 AM Scope Withdrawal Time: 0 hours 19 minutes 58 seconds  Total Procedure Duration: 0 hours 28 minutes 54 seconds  Findings:                 Hemorrhoids were found on perianal  exam.                           Multiple small and large-mouthed diverticula were                            found in the sigmoid colon and descending colon.                            There was no evidence for diverticulitis.                           A 4 mm polyp was found in the rectum. The polyp was                            sessile. The polyp was removed with a cold snare.                            Resection and retrieval were complete. Estimated                            blood loss was minimal.                           A 3 mm polyp was found in the distal ascending                            colon. The polyp was sessile. The polyp was removed                            with a cold snare. Resection and retrieval were                            complete. Estimated blood loss was minimal.                           A 13 mm polyp was found in the proximal  ascending                            colon. The polyp was flat. Polypectomy was                            attempted, initially using a piecemeal technique                            with a cold snare. Polyp resection was incomplete                            with this device because the polyp was on the back                            side of a fold. This intervention then required a  different device and polypectomy technique. The                            base of the polyp was removed with a cold biopsy                            forceps. Resection and retrieval were complete.                            Estimated blood loss was minimal.                           The exam was otherwise without abnormality on                            direct and retroflexion views. Complications:            No immediate complications. Estimated blood loss:                            Minimal. Estimated Blood Loss:     Estimated blood loss was minimal. Impression:               - Hemorrhoids found on perianal exam.                           - Diverticulosis in the sigmoid colon and in the                            descending colon.                           - One 4 mm polyp in the rectum, removed with a cold                            snare. Resected and retrieved.                           - One 3 mm polyp in the distal ascending colon,                            removed with a cold snare. Resected and retrieved.                           - One 13 mm polyp in the proximal ascending colon,                            removed with a cold biopsy forceps. Resected and                            retrieved.                           - The examination was otherwise normal on direct  and retroflexion views. Recommendation:           - Patient has a contact number available for                            emergencies. The signs and symptoms of potential                             delayed complications were discussed with the                            patient. Return to normal activities tomorrow.                            Written discharge instructions were provided to the                            patient.                           - Resume previous diet today. High fiber diet                            encouraged.                           - Continue present medications.                           - Await pathology results.                           - Repeat colonoscopy date to be determined after                            pending pathology results are reviewed for                            surveillance based on pathology results. Given the                            piecemeal resection of the largest polyp, short                            interval surveillance will be recommended to insure                            that the polyp has been completely removed. Thornton Park MD, MD 12/09/2018 9:17:57 AM This report has been signed electronically.

## 2018-12-09 NOTE — Progress Notes (Signed)
PT taken to PACU. Monitors in place. VSS. Report given to RN. 

## 2018-12-09 NOTE — Progress Notes (Signed)
Pt's states no medical or surgical changes since previsit or office visit.  CW vitals, JB temps and MO IV.

## 2018-12-09 NOTE — Progress Notes (Signed)
Called to room to assist during endoscopic procedure.  Patient ID and intended procedure confirmed with present staff. Received instructions for my participation in the procedure from the performing physician.  

## 2018-12-12 ENCOUNTER — Telehealth: Payer: Self-pay | Admitting: *Deleted

## 2018-12-12 ENCOUNTER — Encounter: Payer: Self-pay | Admitting: Gastroenterology

## 2018-12-12 ENCOUNTER — Telehealth: Payer: Self-pay

## 2018-12-12 NOTE — Telephone Encounter (Signed)
No answering machine reached on f/u call 

## 2018-12-12 NOTE — Telephone Encounter (Signed)
No know this person at this number.

## 2018-12-29 ENCOUNTER — Other Ambulatory Visit: Payer: Self-pay

## 2018-12-29 DIAGNOSIS — Z20822 Contact with and (suspected) exposure to covid-19: Secondary | ICD-10-CM

## 2018-12-31 LAB — NOVEL CORONAVIRUS, NAA: SARS-CoV-2, NAA: NOT DETECTED

## 2019-01-13 DIAGNOSIS — Z961 Presence of intraocular lens: Secondary | ICD-10-CM | POA: Diagnosis not present

## 2019-01-13 DIAGNOSIS — H52203 Unspecified astigmatism, bilateral: Secondary | ICD-10-CM | POA: Diagnosis not present

## 2019-01-13 DIAGNOSIS — H524 Presbyopia: Secondary | ICD-10-CM | POA: Diagnosis not present

## 2019-01-16 ENCOUNTER — Other Ambulatory Visit: Payer: Self-pay | Admitting: Family Medicine

## 2019-01-21 MED ORDER — AMLODIPINE BESYLATE 5 MG PO TABS: ORAL_TABLET | ORAL | 1 refills | Status: DC

## 2019-01-21 NOTE — Addendum Note (Signed)
Addended by: Orma Flaming on: 01/21/2019 11:33 AM   Modules accepted: Orders

## 2019-01-21 NOTE — Telephone Encounter (Signed)
See note

## 2019-01-21 NOTE — Telephone Encounter (Signed)
Pt would like Rx sent to  [facility] [address] [place] [address] [phone] (Phone) [phone] (Fax)

## 2019-01-21 NOTE — Telephone Encounter (Signed)
Pt calling to check status. Pt states that she can get medication cheaper at [facility]. Please advise

## 2019-01-21 NOTE — Telephone Encounter (Signed)
Sent to [facility].  Elizabeth Flaming, MD [place]

## 2019-01-23 ENCOUNTER — Telehealth: Payer: Self-pay | Admitting: Family Medicine

## 2019-01-23 NOTE — Telephone Encounter (Signed)
The patient received a call from the [facility] campaign asking her to schedule her AWV. Upon review, patient is due for Welcome to Medicare before June 2021, so I scheduled it with Dr. Rogers Blocker.

## 2019-03-05 ENCOUNTER — Other Ambulatory Visit: Payer: Self-pay

## 2019-03-09 ENCOUNTER — Ambulatory Visit (INDEPENDENT_AMBULATORY_CARE_PROVIDER_SITE_OTHER): Payer: PPO | Admitting: Family Medicine

## 2019-03-09 ENCOUNTER — Other Ambulatory Visit: Payer: Self-pay

## 2019-03-09 ENCOUNTER — Encounter: Payer: Self-pay | Admitting: Family Medicine

## 2019-03-09 VITALS — BP 160/80 | HR 69 | Temp 98.0°F | Ht 67.0 in | Wt 137.4 lb

## 2019-03-09 DIAGNOSIS — N959 Unspecified menopausal and perimenopausal disorder: Secondary | ICD-10-CM | POA: Diagnosis not present

## 2019-03-09 DIAGNOSIS — Z1231 Encounter for screening mammogram for malignant neoplasm of breast: Secondary | ICD-10-CM | POA: Diagnosis not present

## 2019-03-09 DIAGNOSIS — Z0000 Encounter for general adult medical examination without abnormal findings: Secondary | ICD-10-CM | POA: Diagnosis not present

## 2019-03-09 MED ORDER — LISINOPRIL 40 MG PO TABS: 40.0000 mg | ORAL_TABLET | Freq: Every day | ORAL | 3 refills | Status: DC

## 2019-03-09 NOTE — Patient Instructions (Addendum)
covid testing Go to [email or web address] and sign up here or call [phone]  Elizabeth Vincent , Thank you for taking time to come for your Medicare Wellness Visit. I appreciate your ongoing commitment to your health goals. Please review the following plan we discussed and let me know if I can assist you in the future.   These are the goals we discussed: Goals   None     This is a list of the screening recommended for you and due dates:  Health Maintenance  Topic Date Due  . Mammogram  02/13/1999  . Pneumonia vaccines (2 of 2 - PCV13) 10/15/2019  . Tetanus Vaccine  01/30/2028  . Colon Cancer Screening  12/08/2028  . Flu Shot  Completed  . DEXA scan (bone density measurement)  Completed  .  Hepatitis C: One time screening is recommended by Center for Disease Control  (CDC) for  adults born from [birth year] through 1967.   Completed    Advance Directive  Advance directives are legal documents that let you make choices ahead of time about your health care and medical treatment in case you become unable to communicate for yourself. Advance directives are a way for you to make known your wishes to family, friends, and health care providers. This can let others know about your end-of-life care if you become unable to communicate. Discussing and writing advance directives should happen over time rather than all at once. Advance directives can be changed depending on your situation and what you want, even after you have signed the advance directives. There are different types of advance directives, such as:  Medical power of attorney.  Living will.  Do not resuscitate (DNR) or do not attempt resuscitation (DNAR) order. Health care proxy and medical power of attorney A health care proxy is also called a health care agent. This is a person who is appointed to make medical decisions for you in cases where you are unable to make the decisions yourself. Generally, people choose someone they know well  and trust to represent their preferences. Make sure to ask this person for an agreement to act as your proxy. A proxy may have to exercise judgment in the event of a medical decision for which your wishes are not known. A medical power of attorney is a legal document that names your health care proxy. Depending on the laws in your state, after the document is written, it may also need to be:  Signed.  Notarized.  Dated.  Copied.  Witnessed.  Incorporated into your medical record. You may also want to appoint someone to manage your money in a situation in which you are unable to do so. This is called a durable power of attorney for finances. It is a separate legal document from the durable power of attorney for health care. You may choose the same person or someone different from your health care proxy to act as your agent in money matters. If you do not appoint a proxy, or if there is a concern that the proxy is not acting in your best interests, a court may appoint a guardian to act on your behalf. Living will A living will is a set of instructions that state your wishes about medical care when you cannot express them yourself. Health care providers should keep a copy of your living will in your medical record. You may want to give a copy to family members or friends. To alert caregivers in case of an emergency, you can  place a card in your wallet to let them know that you have a living will and where they can find it. A living will is used if you become:  Terminally ill.  Disabled.  Unable to communicate or make decisions. Items to consider in your living will include:  To use or not to use life-support equipment, such as dialysis machines and breathing machines (ventilators).  A DNR or DNAR order. This tells health care providers not to use cardiopulmonary resuscitation (CPR) if breathing or heartbeat stops.  To use or not to use tube feeding.  To be given or not to be given food  and fluids.  Comfort (palliative) care when the goal becomes comfort rather than a cure.  Donation of organs and tissues. A living will does not give instructions for distributing your money and property if you should pass away. DNR or DNAR A DNR or DNAR order is a request not to have CPR in the event that your heart stops beating or you stop breathing. If a DNR or DNAR order has not been made and shared, a health care provider will try to help any patient whose heart has stopped or who has stopped breathing. If you plan to have surgery, talk with your health care provider about how your DNR or DNAR order will be followed if problems occur. What if I do not have an advance directive? If you do not have an advance directive, some states assign family decision makers to act on your behalf based on how closely you are related to them. Each state has its own laws about advance directives. You may want to check with your health care provider, attorney, or state representative about the laws in your state. Summary  Advance directives are the legal documents that allow you to make choices ahead of time about your health care and medical treatment in case you become unable to tell others about your care.  The process of discussing and writing advance directives should happen over time. You can change the advance directives, even after you have signed them.  Advance directives include DNR or DNAR orders, living wills, and designating an agent as your medical power of attorney. This information is not intended to replace advice given to you by your health care provider. Make sure you discuss any questions you have with your health care provider. Document Revised: 09/18/2018 Document Reviewed: 09/18/2018 Elsevier Patient Education  [employer].

## 2019-03-09 NOTE — Progress Notes (Signed)
Phone: [phone]  Subjective:  Patient presents today for their Welcome to Medicare Exam    Preventive Screening-Counseling & Management  Vision screen: 20/20 both eyes  R-20/25, L-20/40  Advanced directives: no  Smoking Status: Never Smoker Second Hand Smoking status: No smokers in home  Risk Factors Regular exercise: none at present.  Diet: well balanced  Fall Risk: Yes, 1   Opioid use history:  no long term opioids use  Cardiac risk factors:  advanced age (older than 30 for men, 70 for women) yes Hyperlipidemia ascvd risk is 15.6%. declines statin No diabetes. none Family History: heart disease in her father. Sister with TIA  Depression Screen None. PHQ2 0  Depression screen [facility] 2/9 03/09/2019 10/15/2018 12/25/2017  Decreased Interest 0 0 0  Down, Depressed, Hopeless 0 0 0  PHQ - 2 Score 0 0 0  Altered sleeping 1 - 0  Tired, decreased energy 0 - 0  Change in appetite 0 - 0  Feeling bad or failure about yourself  0 - 0  Trouble concentrating 0 - 0  Moving slowly or fidgety/restless 0 - 0  Suicidal thoughts 0 - 0  PHQ-9 Score 1 - 0  Difficult doing work/chores Not difficult at all - Not difficult at all    Activities of Daily Living Independent ADLs and IADLs none  Hearing Difficulties: -patient declines  Cognitive Testing No reported trouble.    Normal 3 word recall Mini-cog: 5/5  List the Names of Other Physician/Practitioners you currently use: -Dr. Shearon Stalls   Immunization History  Administered Date(s) Administered  . Influenza, High Dose Seasonal PF 11/14/2018  . Influenza-Unspecified 11/14/2017  . Pneumococcal Polysaccharide-23 10/15/2018  . Tdap 01/29/2018   Required Immunizations needed today none  Screening tests- up to date Health Maintenance Due  Topic Date Due  . MAMMOGRAM  02/13/1999   Review of Systems  Constitutional: Negative for chills, fever and malaise/fatigue.  HENT: Negative for hearing loss and  sore throat.   Eyes: Negative for blurred vision and double vision.  Respiratory: Negative for cough, shortness of breath and wheezing.   Cardiovascular: Negative for chest pain, palpitations and leg swelling.  Gastrointestinal: Negative for abdominal pain, blood in stool, nausea and vomiting.  Genitourinary: Negative for dysuria and hematuria.  Musculoskeletal: Negative for falls.  Skin: Negative for rash.  Neurological: Negative for dizziness and weakness.  Psychiatric/Behavioral: Negative for memory loss and suicidal ideas. The patient is not nervous/anxious and does not have insomnia.      The following were reviewed and entered/updated in epic: Past Medical History:  Diagnosis Date  . Cancer ([place])    T cell Lymphoma  . Cataract   . Hypertension    Patient Active Problem List   Diagnosis Date Noted  . Positive colorectal cancer screening using Cologuard test 11/14/2018  . Essential hypertension 12/26/2017  . Hyperlipidemia 12/26/2017  . 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 BREAST BIOPSY    . CATARACT EXTRACTION, BILATERAL    . EYE SURGERY    . SKIN BIOPSY      Family History  Problem Relation Age of Onset  . Cancer Mother   . Early death Mother   . Heart disease Father   . Alcohol abuse Sister   . Cancer Maternal Grandmother   . Cancer Maternal Grandfather   . Heart disease Paternal Grandmother   . Esophageal cancer Paternal Grandmother   . Cancer Paternal  Grandfather   . Stroke Paternal Grandfather   . Colon cancer Maternal Uncle   . Rectal cancer Neg Hx   . Stomach cancer Neg Hx     Medications- reviewed and updated Current Outpatient Medications  Medication Sig Dispense Refill  . amLODipine (NORVASC) 5 MG tablet TAKE 1 TABLET(5 MG) BY MOUTH DAILY 90 tablet 1  . Multiple Vitamins-Minerals (PRESERVISION AREDS 2+MULTI VIT PO) Take by mouth.    Marland Kitchen lisinopril  (ZESTRIL) 40 MG tablet Take 1 tablet (40 mg total) by mouth daily. 90 tablet 3   No current facility-administered medications for this visit.    Allergies-reviewed and updated No Known Allergies  Social History   Socioeconomic History  . Marital status: Single    Spouse name: Not on file  . Number of children: Not on file  . Years of education: Not on file  . Highest education level: Not on file  Occupational History  . Not on file  Tobacco Use  . Smoking status: Never Smoker  . Smokeless tobacco: Never Used  Substance and Sexual Activity  . Alcohol use: Yes    Comment: wine with dinner  . Drug use: Never  . Sexual activity: Not Currently  Other Topics Concern  . Not on file  Social History Narrative  . Not on file   Social Determinants of Health   Financial Resource Strain:   . Difficulty of Paying Living Expenses: Not on file  Food Insecurity:   . Worried About Charity fundraiser in the Last Year: Not on file  . Ran Out of Food in the Last Year: Not on file  Transportation Needs:   . Lack of Transportation (Medical): Not on file  . Lack of Transportation (Non-Medical): Not on file  Physical Activity:   . Days of Exercise per Week: Not on file  . Minutes of Exercise per Session: Not on file  Stress:   . Feeling of Stress : Not on file  Social Connections:   . Frequency of Communication with Friends and Family: Not on file  . Frequency of Social Gatherings with Friends and Family: Not on file  . Attends Religious Services: Not on file  . Active Member of Clubs or Organizations: Not on file  . Attends Archivist Meetings: Not on file  . Marital Status: Not on file    Objective: BP (!) 160/80 (BP Location: Left Arm, Patient Position: Sitting, Cuff Size: Normal)   Pulse 69   Temp 98 F (36.7 C) (Skin)   Ht 5\' 7"  (1.702 m)   Wt 137 lb 6.4 oz (62.3 kg)   LMP  (LMP Unknown)   SpO2 96%   BMI 21.52 kg/m  Gen: NAD, resting comfortably HEENT: Mucous  membranes are moist. Oropharynx normal Neck: no thyromegaly CV: RRR +systolic [id number] murmur. no rubs or gallops Lungs: CTAB no crackles, wheeze, rhonchi Abdomen: soft/nontender/nondistended/normal bowel sounds. No rebound or guarding.  Ext: no edema Skin: warm, dry Neuro: grossly normal, moves all extremities, PERRLA  Assessment/Plan:   Fall Risk  03/09/2019 12/25/2017  Falls in the past year? 1 No  Number falls in past yr: 0 -  Injury with Fall? 0 -   Depression screen [facility] 2/9 03/09/2019 10/15/2018 12/25/2017  Decreased Interest 0 0 0  Down, Depressed, Hopeless 0 0 0  PHQ - 2 Score 0 0 0  Altered sleeping 1 - 0  Tired, decreased energy 0 - 0  Change in appetite 0 - 0  Feeling bad  or failure about yourself  0 - 0  Trouble concentrating 0 - 0  Moving slowly or fidgety/restless 0 - 0  Suicidal thoughts 0 - 0  PHQ-9 Score 1 - 0  Difficult doing work/chores Not difficult at all - Not difficult at all    Welcome to Medicare exam completed- discussed recommended screenings anddocumented any personalized health advice and referrals for preventive counseling. See AVS as well which was given to patient.   phq9 given and score is a 1. No concern for depression and she is doing well even in pandemic.   -dexa/mmg ordered today.   Status of chronic or acute concerns  Blood pressure: asked she keep a log and will f/u with me in march.  New murmur on exam: f/u in 2 months and order echo if appropriate.  covid testing information given.    Future Appointments  Date Time Provider [place]  05/15/2019  9:40 AM Orma Flaming, MD [facility]   Return if symptoms worsen or fail to improve.   Lab/Order associations: Welcome to Medicare preventive visit  Encounter for screening mammogram for breast cancer - Plan: MM Digital Screening  Menopausal and postmenopausal disorder - Plan: HM DEXA SCAN  Meds ordered this encounter  Medications  . lisinopril (ZESTRIL) 40 MG tablet     Sig: Take 1 tablet (40 mg total) by mouth daily.    Dispense:  90 tablet    Refill:  3   This visit occurred during the SARS-CoV-2 public health emergency.  Safety protocols were in place, including screening questions prior to the visit, additional usage of staff PPE, and extensive cleaning of exam room while observing appropriate contact time as indicated for disinfecting solutions.    Return precautions advised. Orma Flaming, MD [place]

## 2019-03-09 NOTE — Progress Notes (Signed)
Mini cog score was 5/5.

## 2019-03-11 ENCOUNTER — Ambulatory Visit: Payer: PPO | Attending: Internal Medicine

## 2019-03-11 DIAGNOSIS — Z20822 Contact with and (suspected) exposure to covid-19: Secondary | ICD-10-CM

## 2019-03-12 LAB — NOVEL CORONAVIRUS, NAA: SARS-CoV-2, NAA: NOT DETECTED

## 2019-04-08 ENCOUNTER — Other Ambulatory Visit: Payer: Self-pay | Admitting: Family Medicine

## 2019-04-08 DIAGNOSIS — Z1231 Encounter for screening mammogram for malignant neoplasm of breast: Secondary | ICD-10-CM

## 2019-04-18 ENCOUNTER — Ambulatory Visit: Payer: PPO | Attending: Internal Medicine

## 2019-04-18 DIAGNOSIS — Z23 Encounter for immunization: Secondary | ICD-10-CM

## 2019-04-18 NOTE — Progress Notes (Signed)
   Covid-19 Vaccination Clinic  Name:  Elizabeth Vincent    MRN: [id number] DOB: Dec 16, 1950  04/18/2019  [name] was observed post Covid-19 immunization for 15 minutes without incidence. She was provided with Vaccine Information Sheet and instruction to access the V-Safe system.   [name] was instructed to call 911 with any severe reactions post vaccine: Marland Kitchen Difficulty breathing  . Swelling of your face and throat  . A fast heartbeat  . A bad rash all over your body  . Dizziness and weakness    Immunizations Administered    Name Date Dose VIS Date Route   Pfizer COVID-19 Vaccine 04/18/2019  2:57 PM 0.3 mL 02/13/2019 Intramuscular   Manufacturer: [place]   Lot: [id number]   [place]: [id number]

## 2019-04-27 ENCOUNTER — Ambulatory Visit: Payer: PPO

## 2019-05-11 ENCOUNTER — Ambulatory Visit: Payer: PPO | Attending: Internal Medicine

## 2019-05-11 DIAGNOSIS — Z23 Encounter for immunization: Secondary | ICD-10-CM

## 2019-05-11 NOTE — Progress Notes (Signed)
   Covid-19 Vaccination Clinic  Name:  Elizabeth Vincent    MRN: [id number] DOB: 1950/06/11  05/11/2019  Elizabeth Vincent was observed post Covid-19 immunization for 15 minutes without incident. She was provided with Vaccine Information Sheet and instruction to access the V-Safe system.   Elizabeth Vincent was instructed to call 911 with any severe reactions post vaccine: Marland Kitchen Difficulty breathing  . Swelling of face and throat  . A fast heartbeat  . A bad rash all over body  . Dizziness and weakness   Immunizations Administered    Name Date Dose VIS Date Route   Pfizer COVID-19 Vaccine 05/11/2019  3:41 PM 0.3 mL 02/13/2019 Intramuscular   Manufacturer: [place]   Lot: [id number]   [place]: [id number]

## 2019-05-15 ENCOUNTER — Ambulatory Visit (INDEPENDENT_AMBULATORY_CARE_PROVIDER_SITE_OTHER): Payer: PPO | Admitting: Family Medicine

## 2019-05-15 ENCOUNTER — Encounter: Payer: Self-pay | Admitting: Family Medicine

## 2019-05-15 ENCOUNTER — Other Ambulatory Visit: Payer: Self-pay

## 2019-05-15 VITALS — BP 140/82 | HR 75 | Temp 97.8°F | Ht 67.0 in | Wt 134.2 lb

## 2019-05-15 DIAGNOSIS — I10 Essential (primary) hypertension: Secondary | ICD-10-CM

## 2019-05-15 DIAGNOSIS — E782 Mixed hyperlipidemia: Secondary | ICD-10-CM | POA: Diagnosis not present

## 2019-05-15 DIAGNOSIS — R7309 Other abnormal glucose: Secondary | ICD-10-CM

## 2019-05-15 LAB — LIPID PANEL
Cholesterol: 237 mg/dL — ABNORMAL HIGH (ref 0–200)
HDL: 58.2 mg/dL (ref >39.00)
LDL Cholesterol: 163 mg/dL — ABNORMAL HIGH (ref 0–99)
NonHDL: 178.38
Total CHOL/HDL Ratio: 4
Triglycerides: 75 mg/dL (ref 0.0–149.0)
VLDL: 15 mg/dL (ref 0.0–40.0)

## 2019-05-15 LAB — COMPREHENSIVE METABOLIC PANEL
ALT: 8 U/L (ref 0–35)
AST: 14 U/L (ref 0–37)
Albumin: 4.4 g/dL (ref 3.5–5.2)
Alkaline Phosphatase: 35 U/L — ABNORMAL LOW (ref 39–117)
BUN: 14 mg/dL (ref 6–23)
CO2: 29 mEq/L (ref 19–32)
Calcium: 9.3 mg/dL (ref 8.4–10.5)
Chloride: 100 mEq/L (ref 96–112)
Creatinine, Ser: 0.75 mg/dL (ref 0.40–1.20)
GFR: 76.34 mL/min (ref >60.00)
Glucose, Bld: 96 mg/dL (ref 70–99)
Potassium: 4.3 mEq/L (ref 3.5–5.1)
Sodium: 136 mEq/L (ref 135–145)
Total Bilirubin: 0.4 mg/dL (ref 0.2–1.2)
Total Protein: 6.9 g/dL (ref 6.0–8.3)

## 2019-05-15 LAB — CBC WITH DIFFERENTIAL/PLATELET
Basophils Absolute: 0 10*3/uL (ref 0.0–0.1)
Basophils Relative: 1.2 % (ref 0.0–3.0)
Eosinophils Absolute: 0 10*3/uL (ref 0.0–0.7)
Eosinophils Relative: 0.8 % (ref 0.0–5.0)
HCT: 39.4 % (ref 36.0–46.0)
Hemoglobin: 13.2 g/dL (ref 12.0–15.0)
Lymphocytes Relative: 28.4 % (ref 12.0–46.0)
Lymphs Abs: 1 10*3/uL (ref 0.7–4.0)
MCHC: 33.5 g/dL (ref 30.0–36.0)
MCV: 94.1 fl (ref 78.0–100.0)
Monocytes Absolute: 0.3 10*3/uL (ref 0.1–1.0)
Monocytes Relative: 8.3 % (ref 3.0–12.0)
Neutro Abs: 2.2 10*3/uL (ref 1.4–7.7)
Neutrophils Relative %: 61.3 % (ref 43.0–77.0)
Platelets: 295 10*3/uL (ref 150.0–400.0)
RBC: 4.18 Mil/uL (ref 3.87–5.11)
RDW: 13.4 % (ref 11.5–15.5)
WBC: 3.5 10*3/uL — ABNORMAL LOW (ref 4.0–10.5)

## 2019-05-15 LAB — HEMOGLOBIN A1C: Hgb A1c MFr Bld: 5.6 % (ref 4.6–6.5)

## 2019-05-15 NOTE — Progress Notes (Signed)
Patient: Elizabeth Vincent MRN: [id number] DOB: Jun 10, 1950 PCP: Orma Flaming, MD     Subjective:  Chief Complaint  Patient presents with  . Hypertension  . Hyperlipidemia    HPI: The patient is a 69 y.o. female who presents today for hypertension and hyperlipidemia follow up.   Hypertension: Here for follow up of hypertension.  Currently on lisinopril 40mg  and norvasc 5mg  . Home readings range from 0000000 [id number] diastolic. Takes medication as prescribed and denies any side effects. Exercise includes walking. Weight has been stable. Denies any chest pain, headaches, shortness of breath, vision changes, swelling in lower extremities.   Hyperlipidemia: The 10-year ASCVD risk score Mikey Bussing DC Jr., et al., 2013) is: 14.9% Discussed statin in past and she has declined. Fasting labs today. Discussed today and possibility of CCS.   Elevated blood sugar: checking a1c today.   Received both of her covid shots.   Review of Systems  Constitutional: Negative for chills and fever.  HENT: Negative for congestion and ear pain.   Eyes: Negative for discharge and itching.  Respiratory: Negative for cough, chest tightness and wheezing.   Cardiovascular: Negative for chest pain, palpitations and leg swelling.  Gastrointestinal: Negative for nausea and vomiting.  Endocrine: Negative for polyuria.  Genitourinary: Negative for dysuria and frequency.  Musculoskeletal: Negative for back pain, gait problem and joint swelling.  Skin: Negative for color change.  Neurological: Negative for dizziness, seizures, syncope and weakness.  Psychiatric/Behavioral: Negative for confusion, hallucinations and suicidal ideas.    Allergies Patient has No Known Allergies.  Past Medical History Patient  has a past medical history of Cancer ([place]), Cataract, and Hypertension.  Surgical History Patient  has a past surgical history that includes Eye surgery; Cataract extraction, bilateral; Skin biopsy; and Breast  biopsy.  Family History Pateint's family history includes Alcohol abuse in her sister; Cancer in her maternal grandfather, maternal grandmother, mother, and paternal grandfather; Colon cancer in her maternal uncle; Early death in her mother; Esophageal cancer in her paternal grandmother; Heart disease in her father and paternal grandmother; Stroke in her paternal grandfather.  Social History Patient  reports that she has never smoked. She has never used smokeless tobacco. She reports current alcohol use. She reports that she does not use drugs.    Objective: Vitals:   05/15/19 0927  BP: 140/82  Pulse: 75  Temp: 97.8 F (36.6 C)  TempSrc: Temporal  SpO2: 95%  Weight: 134 lb 3.2 oz (60.9 kg)  Height: 5\' 7"  (1.702 m)    Body mass index is 21.02 kg/m.  Physical Exam Vitals reviewed.  Constitutional:      Appearance: Normal appearance. She is well-developed and normal weight.  HENT:     Head: Normocephalic and atraumatic.     Right Ear: Tympanic membrane, ear canal and external ear normal.     Left Ear: Tympanic membrane, ear canal and external ear normal.     Mouth/Throat:     Mouth: Mucous membranes are moist.  Eyes:     Extraocular Movements: Extraocular movements intact.     Conjunctiva/sclera: Conjunctivae normal.     Pupils: Pupils are equal, round, and reactive to light.  Neck:     Thyroid: No thyromegaly.     Vascular: No carotid bruit.  Cardiovascular:     Rate and Rhythm: Normal rate and regular rhythm.     Pulses: Normal pulses.     Heart sounds: Murmur (very faint systolic murmur (right sternal border) ) present.  Pulmonary:  Effort: Pulmonary effort is normal.     Breath sounds: Normal breath sounds.  Abdominal:     General: Abdomen is flat. Bowel sounds are normal. There is no distension.     Palpations: Abdomen is soft.     Tenderness: There is no abdominal tenderness.  Musculoskeletal:     Cervical back: Normal range of motion and neck supple.   Lymphadenopathy:     Cervical: No cervical adenopathy.  Skin:    General: Skin is warm and dry.     Capillary Refill: Capillary refill takes less than 2 seconds.     Findings: No rash.  Neurological:     General: No focal deficit present.     Mental Status: She is alert and oriented to person, place, and time.     Cranial Nerves: No cranial nerve deficit.     Coordination: Coordination normal.     Deep Tendon Reflexes: Reflexes normal.  Psychiatric:        Mood and Affect: Mood normal.        Behavior: Behavior normal.      Office Visit from 03/09/2019 in [facility]  PHQ-2 Total Score  0         Assessment/plan: 1. Essential hypertension Blood pressure is to goal. Continue current anti-hypertensive medications (lisinopril and norvasc). routine lab work will be done today. Recommended routine exercise and healthy diet including DASH diet and mediterranean diet. Encouraged weight loss. F/u in 6 months.   - CBC with Differential/Platelet - Comprehensive metabolic panel  2. Mixed hyperlipidemia Discussed ASCVD risk again. She may be more interested to start statin vs. CCS. Discussed this is self pay. Will see what her lipid panel looks like.  - Lipid panel  3. Elevated glucose   - Hemoglobin A1c    This visit occurred during the SARS-CoV-2 public health emergency.  Safety protocols were in place, including screening questions prior to the visit, additional usage of staff PPE, and extensive cleaning of exam room while observing appropriate contact time as indicated for disinfecting solutions.     Return in about 6 months (around 11/15/2019) for htn f/u .   Orma Flaming, MD [place]   05/15/2019

## 2019-05-15 NOTE — Patient Instructions (Signed)
-  labs today. Think about cholesterol drug or coronary artery scan. We will see what your risk is today.   -see you back in 6 months! Enjoy the spring weather!  dr. Rogers Blocker

## 2019-06-24 ENCOUNTER — Other Ambulatory Visit: Payer: Self-pay | Admitting: Family Medicine

## 2019-06-24 DIAGNOSIS — E2839 Other primary ovarian failure: Secondary | ICD-10-CM

## 2019-06-26 ENCOUNTER — Ambulatory Visit
Admission: RE | Admit: 2019-06-26 | Discharge: 2019-06-26 | Disposition: A | Discharge status: Home / Self Care | Payer: PPO | Source: Ambulatory Visit | Attending: Family Medicine | Admitting: Family Medicine

## 2019-06-26 ENCOUNTER — Other Ambulatory Visit: Payer: Self-pay

## 2019-06-26 DIAGNOSIS — Z1231 Encounter for screening mammogram for malignant neoplasm of breast: Secondary | ICD-10-CM

## 2019-06-26 DIAGNOSIS — M810 Age-related osteoporosis without current pathological fracture: Secondary | ICD-10-CM | POA: Diagnosis not present

## 2019-06-26 DIAGNOSIS — Z780 Asymptomatic menopausal state: Secondary | ICD-10-CM | POA: Diagnosis not present

## 2019-06-26 DIAGNOSIS — M8588 Other specified disorders of bone density and structure, other site: Secondary | ICD-10-CM | POA: Diagnosis not present

## 2019-06-30 ENCOUNTER — Other Ambulatory Visit: Payer: Self-pay | Admitting: Family Medicine

## 2019-06-30 DIAGNOSIS — R928 Other abnormal and inconclusive findings on diagnostic imaging of breast: Secondary | ICD-10-CM

## 2019-07-08 ENCOUNTER — Encounter: Payer: Self-pay | Admitting: Family Medicine

## 2019-07-08 DIAGNOSIS — M8580 Other specified disorders of bone density and structure, unspecified site: Secondary | ICD-10-CM | POA: Insufficient documentation

## 2019-07-08 DIAGNOSIS — M810 Age-related osteoporosis without current pathological fracture: Secondary | ICD-10-CM | POA: Insufficient documentation

## 2019-07-09 ENCOUNTER — Other Ambulatory Visit: Payer: Self-pay

## 2019-07-09 ENCOUNTER — Ambulatory Visit: Payer: PPO

## 2019-07-09 ENCOUNTER — Ambulatory Visit
Admission: RE | Admit: 2019-07-09 | Discharge: 2019-07-09 | Disposition: A | Discharge status: Home / Self Care | Payer: PPO | Source: Ambulatory Visit | Attending: Family Medicine | Admitting: Family Medicine

## 2019-07-09 DIAGNOSIS — R922 Inconclusive mammogram: Secondary | ICD-10-CM | POA: Diagnosis not present

## 2019-07-09 DIAGNOSIS — R928 Other abnormal and inconclusive findings on diagnostic imaging of breast: Secondary | ICD-10-CM

## 2019-07-14 ENCOUNTER — Ambulatory Visit (AMBULATORY_SURGERY_CENTER): Payer: Self-pay | Admitting: *Deleted

## 2019-07-14 ENCOUNTER — Other Ambulatory Visit: Payer: Self-pay

## 2019-07-14 VITALS — Temp 97.6°F | Ht 67.0 in | Wt 135.0 lb

## 2019-07-14 DIAGNOSIS — Z86010 Personal history of colonic polyps: Secondary | ICD-10-CM

## 2019-07-14 NOTE — Progress Notes (Signed)

## 2019-07-16 ENCOUNTER — Encounter: Payer: Self-pay | Admitting: Gastroenterology

## 2019-07-16 ENCOUNTER — Other Ambulatory Visit: Payer: Self-pay | Admitting: Family Medicine

## 2019-07-16 NOTE — Telephone Encounter (Signed)
Last OV 05/15/19 Last refill 01/21/19 #90/1 Next OV 11/18/19

## 2019-07-28 ENCOUNTER — Ambulatory Visit (AMBULATORY_SURGERY_CENTER): Payer: PPO | Admitting: Gastroenterology

## 2019-07-28 ENCOUNTER — Encounter: Payer: Self-pay | Admitting: Gastroenterology

## 2019-07-28 ENCOUNTER — Other Ambulatory Visit: Payer: Self-pay

## 2019-07-28 VITALS — BP 109/58 | HR 60 | Temp 96.8°F | Resp 13 | Ht 67.0 in | Wt 134.0 lb

## 2019-07-28 DIAGNOSIS — Z1211 Encounter for screening for malignant neoplasm of colon: Secondary | ICD-10-CM | POA: Diagnosis not present

## 2019-07-28 DIAGNOSIS — K635 Polyp of colon: Secondary | ICD-10-CM | POA: Diagnosis not present

## 2019-07-28 DIAGNOSIS — Z86010 Personal history of colonic polyps: Secondary | ICD-10-CM | POA: Diagnosis not present

## 2019-07-28 DIAGNOSIS — D122 Benign neoplasm of ascending colon: Secondary | ICD-10-CM

## 2019-07-28 DIAGNOSIS — D125 Benign neoplasm of sigmoid colon: Secondary | ICD-10-CM

## 2019-07-28 MED ORDER — SODIUM CHLORIDE 0.9 % IV SOLN
500.0000 mL | Freq: Once | INTRAVENOUS | Status: DC
Start: 2019-07-28 — End: 2019-07-28

## 2019-07-28 NOTE — Progress Notes (Signed)
Pt's states no medical or surgical changes since previsit or office visit.  ° °VS DT °

## 2019-07-28 NOTE — Progress Notes (Signed)
A and O x3. Report to RN. Tolerated MAC anesthesia well.

## 2019-07-28 NOTE — Op Note (Signed)
[facility] Patient Name: Elizabeth Vincent Procedure Date: 07/28/2019 10:33 AM MRN: [id number] Endoscopist: Thornton Park MD, MD Age: 69 Referring MD:  Date of Birth: 16-Apr-1950 Gender: Female Account #: [id number] Procedure:                Colonoscopy Indications:              Surveillance: Personal history of piecemeal removal                            of large sessile adenoma on last colonoscopy (less                            than 1 year ago)                           Colonoscopy 12/09/18 showed a proximal ascending                            colon polyp that was removed in a piecemeal                            fashion. Repeat exam recommended in 6 months to                            insure complete resection. Medicines:                Monitored Anesthesia Care Procedure:                Pre-Anesthesia Assessment:                           - Prior to the procedure, a History and Physical                            was performed, and patient medications and                            allergies were reviewed. The patient's tolerance of                            previous anesthesia was also reviewed. The risks                            and benefits of the procedure and the sedation                            options and risks were discussed with the patient.                            All questions were answered, and informed consent                            was obtained. Prior Anticoagulants: The patient has  taken no previous anticoagulant or antiplatelet                            agents. ASA Grade Assessment: II - A patient with                            mild systemic disease. After reviewing the risks                            and benefits, the patient was deemed in                            satisfactory condition to undergo the procedure.                           After obtaining informed consent, the colonoscope            was passed under direct vision. Throughout the                            procedure, the patient's blood pressure, pulse, and                            oxygen saturations were monitored continuously. The                            Colonoscope was introduced through the anus and                            advanced to the 3 cm into the ileum. The                            colonoscopy was performed with moderate difficulty                            due to a redundant colon, significant looping and a                            tortuous colon. Successful completion of the                            procedure was aided by applying abdominal pressure.                            The patient tolerated the procedure well. The                            quality of the bowel preparation was good. The                            terminal ileum, ileocecal valve, appendiceal                            orifice, and rectum were photographed. Scope  In: 10:45:49 AM Scope Out: 11:10:51 AM Scope Withdrawal Time: 0 hours 15 minutes 56 seconds  Total Procedure Duration: 0 hours 25 minutes 2 seconds  Findings:                 Non-bleeding external and internal hemorrhoids were                            found. The hemorrhoids were small.                           Multiple small and large-mouthed diverticula were                            found in the sigmoid colon and descending colon.                           A 2 mm polyp was found in the sigmoid colon. The                            polyp was sessile. The polyp was removed with a                            cold snare. Resection and retrieval were complete.                            Estimated blood loss was minimal.                           Two sessile polyps were found in the ascending                            colon. The polyps were less than 1 mm in size.                            These polyps were removed with a cold biopsy                             forceps. Resection and retrieval were complete.                            Estimated blood loss was minimal.                           A 10 mm polyp was found in the distal ascending                            colon. The polyp was flat. The polyp was removed                            with a piecemeal technique using a cold snare.                            Ibelive that resection and retrieval were  complete,                            but, the edges of the polyp were difficult to                            assess after the piecemeal resection. Estimated                            blood loss was minimal.                           The exam was otherwise without abnormality on                            direct and retroflexion views. Complications:            No immediate complications. Estimated blood loss:                            Minimal. Estimated Blood Loss:     Estimated blood loss was minimal. Impression:               - Non-bleeding external and internal hemorrhoids.                           - Diverticulosis in the sigmoid colon and in the                            descending colon.                           - One 2 mm polyp in the sigmoid colon, removed with                            a cold snare. Resected and retrieved.                           - Two less than 1 mm polyps in the ascending colon,                            removed with a cold biopsy forceps. Resected and                            retrieved.                           - One 10 mm polyp in the distal ascending colon,                            removed with a cold snare and removed piecemeal                            using a cold snare. Resected and retrieved.                           -  The examination was otherwise normal on direct                            and retroflexion views. Recommendation:           - Patient has a contact number available for                            emergencies. The signs  and symptoms of potential                            delayed complications were discussed with the                            patient. Return to normal activities tomorrow.                            Written discharge instructions were provided to the                            patient.                           - Follow a high fiber diet. Drink at least 64                            ounces of water daily. Add a daily stool bulking                            agent such as psyllium (an exampled would be                            Metamucil).                           - Continue present medications.                           - Await pathology results.                           - Repeat colonoscopy in 6 months to review the                            polypectomy site.                           - Emerging evidence supports eating a diet of                            fruits, vegetables, grains, calcium, and yogurt                            while reducing red meat and alcohol may reduce the  risk of colon cancer.                           - Thank you for allowing me to be involved in your                            colon cancer prevention. Thornton Park MD, MD 07/28/2019 11:21:34 AM This report has been signed electronically.

## 2019-07-28 NOTE — Progress Notes (Signed)
Called to room to assist during endoscopic procedure.  Patient ID and intended procedure confirmed with present staff. Received instructions for my participation in the procedure from the performing physician.  

## 2019-07-28 NOTE — Patient Instructions (Signed)
Await results of polyps removed   Repeat colonoscopy in 6 months   Follow a high fiber diet ,drink at least 64 ozs of water per day , add a psyllium agent such as Metamucil daily - see procedure report for fruit and vegetable recommendation   YOU HAD AN ENDOSCOPIC PROCEDURE TODAY AT [facility]:   Refer to the procedure report that was given to you for any specific questions about what was found during the examination.  If the procedure report does not answer your questions, please call your gastroenterologist to clarify.  If you requested that your care partner not be given the details of your procedure findings, then the procedure report has been included in a sealed envelope for you to review at your convenience later.  YOU SHOULD EXPECT: Some feelings of bloating in the abdomen. Passage of more gas than usual.  Walking can help get rid of the air that was put into your GI tract during the procedure and reduce the bloating. If you had a lower endoscopy (such as a colonoscopy or flexible sigmoidoscopy) you may notice spotting of blood in your stool or on the toilet paper. If you underwent a bowel prep for your procedure, you may not have a normal bowel movement for a few days.  Please Note:  You might notice some irritation and congestion in your nose or some drainage.  This is from the oxygen used during your procedure.  There is no need for concern and it should clear up in a day or so.  SYMPTOMS TO REPORT IMMEDIATELY:   Following lower endoscopy (colonoscopy or flexible sigmoidoscopy):  Excessive amounts of blood in the stool  Significant tenderness or worsening of abdominal pains  Swelling of the abdomen that is new, acute  Fever of 100F or higher    For urgent or emergent issues, a gastroenterologist can be reached at any hour by calling [phone]. Do not use MyChart messaging for urgent concerns.    DIET:  We do recommend a small meal at first, but then  you may proceed to your regular diet.  Drink plenty of fluids but you should avoid alcoholic beverages for 24 hours.  ACTIVITY:  You should plan to take it easy for the rest of today and you should NOT DRIVE or use heavy machinery until tomorrow (because of the sedation medicines used during the test).    FOLLOW UP: Our staff will call the number listed on your records 48-72 hours following your procedure to check on you and address any questions or concerns that you may have regarding the information given to you following your procedure. If we do not reach you, we will leave a message.  We will attempt to reach you two times.  During this call, we will ask if you have developed any symptoms of COVID 19. If you develop any symptoms (ie: fever, flu-like symptoms, shortness of breath, cough etc.) before then, please call [phone].  If you test positive for Covid 19 in the 2 weeks post procedure, please call and report this information to [place].    If any biopsies were taken you will be contacted by phone or by letter within the next 1-3 weeks.  Please call us at [phone] if you have not heard about the biopsies in 3 weeks.    SIGNATURES/CONFIDENTIALITY: You and/or your care partner have signed paperwork which will be entered into your electronic medical record.  These signatures attest to the fact  that that the information above on your After Visit Summary has been reviewed and is understood.  Full responsibility of the confidentiality of this discharge information lies with you and/or your care-partner.

## 2019-07-30 ENCOUNTER — Telehealth: Payer: Self-pay | Admitting: *Deleted

## 2019-07-30 NOTE — Telephone Encounter (Signed)
  Follow up Call-  Call back number 07/28/2019 12/09/2018  Post procedure Call Back phone  # [phone]  Permission to leave phone message Yes Yes  Some recent data might be hidden     Patient questions:  Do you have a fever, pain , or abdominal swelling? No. Pain Score  0 *  Have you tolerated food without any problems? Yes.    Have you been able to return to your normal activities? Yes.    Do you have any questions about your discharge instructions: Diet   No. Medications  No. Follow up visit  No.  Do you have questions or concerns about your Care? No.  Actions: * If pain score is 4 or above: 1. No action needed, pain <4.Have you developed a fever since your procedure? no  2.   Have you had an respiratory symptoms (SOB or cough) since your procedure? no  3.   Have you tested positive for COVID 19 since your procedure no  4.   Have you had any family members/close contacts diagnosed with the COVID 19 since your procedure?  no   If yes to any of these questions please route to Joylene John, RN and Erenest Rasher, RN

## 2019-11-18 ENCOUNTER — Other Ambulatory Visit: Payer: Self-pay

## 2019-11-18 ENCOUNTER — Ambulatory Visit (INDEPENDENT_AMBULATORY_CARE_PROVIDER_SITE_OTHER): Payer: PPO | Admitting: Family Medicine

## 2019-11-18 ENCOUNTER — Encounter: Payer: Self-pay | Admitting: Family Medicine

## 2019-11-18 VITALS — BP 133/83 | HR 68 | Temp 97.9°F | Ht 67.0 in | Wt 132.4 lb

## 2019-11-18 DIAGNOSIS — I10 Essential (primary) hypertension: Secondary | ICD-10-CM

## 2019-11-18 DIAGNOSIS — H6123 Impacted cerumen, bilateral: Secondary | ICD-10-CM | POA: Diagnosis not present

## 2019-11-18 DIAGNOSIS — M810 Age-related osteoporosis without current pathological fracture: Secondary | ICD-10-CM

## 2019-11-18 NOTE — Patient Instructions (Signed)
F/u in 6 months for regular appointment FASTING labs!   Proud of you! Dr. Rogers Blocker

## 2019-11-18 NOTE — Progress Notes (Addendum)
Patient: Elizabeth Vincent MRN: [id number] DOB: 1950-03-12 PCP: Orma Flaming, MD     Subjective:  Chief Complaint  Patient presents with  . Hypertension  . Hyperlipidemia  . Ear Fullness    HPI: The patient is a 69 y.o. female who presents today for Hypertension. She complains of ear fullness.   Hypertension: Here for follow up of hypertension.  Currently on lisinopril 40mg /day and norvasc 5mg /day. Home readings range from 409-811 [id number]/62 diastolic. Takes medication as prescribed and denies any side effects. Exercise includes walking. Weight has been stable. Denies any chest pain, headaches, shortness of breath, vision changes, swelling in lower extremities.   Has had covid vaccines and HM UTD> flu high dose in October.   Hyperlipidemia Declines statin. Have discussed risks.   Ear fullness She thinks she needs them cleaned out today.    Review of Systems  Constitutional: Negative for chills, fatigue and fever.  HENT: Negative for dental problem, ear pain, hearing loss and trouble swallowing.   Eyes: Negative for visual disturbance.  Respiratory: Negative for cough, chest tightness, shortness of breath and wheezing.   Cardiovascular: Negative for chest pain, palpitations and leg swelling.  Gastrointestinal: Negative for abdominal pain, blood in stool, diarrhea and nausea.  Endocrine: Negative for cold intolerance, polydipsia, polyphagia and polyuria.  Genitourinary: Negative for dysuria, frequency, hematuria and urgency.  Musculoskeletal: Negative for arthralgias.  Skin: Negative for rash.  Neurological: Negative for dizziness, light-headedness and headaches.  Psychiatric/Behavioral: Negative for dysphoric mood and sleep disturbance. The patient is not nervous/anxious.     Allergies Patient has No Known Allergies.  Past Medical History Patient  has a past medical history of Cancer ([place]), Cataract, Hypertension, and Osteoporosis.  Surgical History Patient  has a  past surgical history that includes Eye surgery; Cataract extraction, bilateral; Skin biopsy; and Breast biopsy.  Family History Pateint's family history includes Alcohol abuse in her sister; Cancer in her maternal grandfather, maternal grandmother, mother, and paternal grandfather; Colon cancer in her maternal uncle; Early death in her mother; Heart disease in her father and paternal grandmother; Stroke in her paternal grandfather.  Social History Patient  reports that she has never smoked. She has never used smokeless tobacco. She reports current alcohol use. She reports that she does not use drugs.    Objective: Vitals:   11/18/19 0955  BP: 133/83  Pulse: 68  Temp: 97.9 F (36.6 C)  TempSrc: Temporal  SpO2: 98%  Weight: 132 lb 6.4 oz (60.1 kg)  Height: 5\' 7"  (1.702 m)    Body mass index is 20.74 kg/m.  Physical Exam Vitals reviewed.  Constitutional:      Appearance: Normal appearance. She is well-developed and normal weight.  HENT:     Head: Normocephalic and atraumatic.     Right Ear: Ear canal and external ear normal. There is impacted cerumen.     Left Ear: Ear canal and external ear normal. There is impacted cerumen.     Mouth/Throat:     Mouth: Mucous membranes are moist.  Eyes:     Extraocular Movements: Extraocular movements intact.     Conjunctiva/sclera: Conjunctivae normal.     Pupils: Pupils are equal, round, and reactive to light.  Neck:     Thyroid: No thyromegaly.  Cardiovascular:     Rate and Rhythm: Normal rate and regular rhythm.     Pulses: Normal pulses.     Heart sounds: Normal heart sounds. No murmur heard.   Pulmonary:     Effort: Pulmonary  effort is normal.     Breath sounds: Normal breath sounds.  Abdominal:     General: Abdomen is flat. Bowel sounds are normal. There is no distension.     Palpations: Abdomen is soft.     Tenderness: There is no abdominal tenderness.  Musculoskeletal:     Cervical back: Normal range of motion and neck  supple.  Lymphadenopathy:     Cervical: No cervical adenopathy.  Skin:    General: Skin is warm and dry.     Capillary Refill: Capillary refill takes less than 2 seconds.     Findings: No rash.  Neurological:     General: No focal deficit present.     Mental Status: She is alert and oriented to person, place, and time.     Cranial Nerves: No cranial nerve deficit.     Coordination: Coordination normal.     Deep Tendon Reflexes: Reflexes normal.  Psychiatric:        Mood and Affect: Mood normal.        Behavior: Behavior normal.    Ceruminosis is noted.  Verbal consent obtained. Wax is removed by syringing and manual debridement. Instructions for home care to prevent wax buildup are given. After removal-TM visualized bilaterally and wnl.      Assessment/plan: 1. Essential hypertension Blood pressure is to goal. Continue current anti-hypertensive medications per hpi. Refills not given and routine lab work will be done today. Recommended routine exercise and healthy diet including DASH diet and mediterranean diet. Encouraged weight loss. F/u in 6 months.   - Comprehensive metabolic panel; Future - CBC with Differential/Platelet; Future  2. Osteoporosis without current pathological fracture, unspecified osteoporosis type Discussed appropriate calcium and vitamin D replacement.  -weight bearing activities -fosamax vs. Prolia. She will look these up and let me know if interested.   3. Bilateral cerumen impaction -removed and tm wnl.   This visit occurred during the SARS-CoV-2 public health emergency.  Safety protocols were in place, including screening questions prior to the visit, additional usage of staff PPE, and extensive cleaning of exam room while observing appropriate contact time as indicated for disinfecting solutions.     Return in about 6 months (around 05/17/2020) for chol/blood pressure/fasting labs .   Orma Flaming, MD [facility]   11/18/2019

## 2019-11-19 LAB — CBC WITH DIFFERENTIAL/PLATELET
Absolute Monocytes: 423 cells/uL (ref 200–950)
Basophils Absolute: 20 cells/uL (ref 0–200)
Basophils Relative: 0.4 %
Eosinophils Absolute: 20 cells/uL (ref 15–500)
Eosinophils Relative: 0.4 %
HCT: 42.2 % (ref 35.0–45.0)
Hemoglobin: 13.6 g/dL (ref 11.7–15.5)
Lymphs Abs: 1367 cells/uL (ref 850–3900)
MCH: 30.3 pg (ref 27.0–33.0)
MCHC: 32.2 g/dL (ref 32.0–36.0)
MCV: 94 fL (ref 80.0–100.0)
MPV: 9.2 fL (ref 7.5–12.5)
Monocytes Relative: 8.3 %
Neutro Abs: 3269 cells/uL (ref 1500–7800)
Neutrophils Relative %: 64.1 %
Platelets: 349 10*3/uL (ref 140–400)
RBC: 4.49 10*6/uL (ref 3.80–5.10)
RDW: 12.6 % (ref 11.0–15.0)
Total Lymphocyte: 26.8 %
WBC: 5.1 10*3/uL (ref 3.8–10.8)

## 2019-11-19 LAB — COMPREHENSIVE METABOLIC PANEL
AG Ratio: 2.3 (calc) (ref 1.0–2.5)
ALT: 7 U/L (ref 6–29)
AST: 11 U/L (ref 10–35)
Albumin: 4.6 g/dL (ref 3.6–5.1)
Alkaline phosphatase (APISO): 37 U/L (ref 37–153)
BUN: 14 mg/dL (ref 7–25)
CO2: 29 mmol/L (ref 20–32)
Calcium: 9.3 mg/dL (ref 8.6–10.4)
Chloride: 99 mmol/L (ref 98–110)
Creat: 0.75 mg/dL (ref 0.60–0.93)
Globulin: 2 g/dL (calc) (ref 1.9–3.7)
Glucose, Bld: 96 mg/dL (ref 65–99)
Potassium: 4.6 mmol/L (ref 3.5–5.3)
Sodium: 137 mmol/L (ref 135–146)
Total Bilirubin: 0.5 mg/dL (ref 0.2–1.2)
Total Protein: 6.6 g/dL (ref 6.1–8.1)

## 2020-01-14 DIAGNOSIS — H524 Presbyopia: Secondary | ICD-10-CM | POA: Diagnosis not present

## 2020-01-14 DIAGNOSIS — H52203 Unspecified astigmatism, bilateral: Secondary | ICD-10-CM | POA: Diagnosis not present

## 2020-01-14 DIAGNOSIS — Z961 Presence of intraocular lens: Secondary | ICD-10-CM | POA: Diagnosis not present

## 2020-02-08 ENCOUNTER — Other Ambulatory Visit: Payer: Self-pay | Admitting: Family Medicine

## 2020-03-02 ENCOUNTER — Encounter: Payer: Self-pay | Admitting: Gastroenterology

## 2020-03-11 ENCOUNTER — Ambulatory Visit (INDEPENDENT_AMBULATORY_CARE_PROVIDER_SITE_OTHER): Payer: PPO

## 2020-03-11 ENCOUNTER — Other Ambulatory Visit: Payer: Self-pay

## 2020-03-11 VITALS — BP 138/68 | HR 68 | Wt 133.4 lb

## 2020-03-11 DIAGNOSIS — Z0000 Encounter for general adult medical examination without abnormal findings: Secondary | ICD-10-CM

## 2020-03-11 NOTE — Patient Instructions (Addendum)
Elizabeth Vincent , Thank you for taking time to come for your Medicare Wellness Visit. I appreciate your ongoing commitment to your health goals. Please review the following plan we discussed and let me know if I can assist you in the future.   Screening recommendations/referrals: Colonoscopy: Done 07/28/19 Mammogram: Done 07/09/19 Bone Density: Done 06/26/19 Recommended yearly ophthalmology/optometry visit for glaucoma screening and checkup Recommended yearly dental visit for hygiene and checkup  Vaccinations: Influenza vaccine: Done 12/11/19 Up to date Pneumococcal vaccine: Done 10/15/18 Tdap vaccine: Up to date Shingles vaccine: Completed 12/31/19 & 02/29/20 Covid-19:Completed 2/13/, 3/8, & 12/02/19  Advanced directives:   Conditions/risks identified: lose weight   Next appointment: Follow up in one year for your annual wellness visit    Preventive Care 72 Years and Older, Female Preventive care refers to lifestyle choices and visits with your health care provider that can promote health and wellness. What does preventive care include?  A yearly physical exam. This is also called an annual well check.  Dental exams once or twice a year.  Routine eye exams. Ask your health care provider how often you should have your eyes checked.  Personal lifestyle choices, including:  Daily care of your teeth and gums.  Regular physical activity.  Eating a healthy diet.  Avoiding tobacco and drug use.  Limiting alcohol use.  Practicing safe sex.  Taking low-dose aspirin every day.  Taking vitamin and mineral supplements as recommended by your health care provider. What happens during an annual well check? The services and screenings done by your health care provider during your annual well check will depend on your age, overall health, lifestyle risk factors, and family history of disease. Counseling  Your health care provider may ask you questions about your:  Alcohol  use.  Tobacco use.  Drug use.  Emotional well-being.  Home and relationship well-being.  Sexual activity.  Eating habits.  History of falls.  Memory and ability to understand (cognition).  Work and work Statistician.  Reproductive health. Screening  You may have the following tests or measurements:  Height, weight, and BMI.  Blood pressure.  Lipid and cholesterol levels. These may be checked every 5 years, or more frequently if you are over 25 years old.  Skin check.  Lung cancer screening. You may have this screening every year starting at age 52 if you have a 30-pack-year history of smoking and currently smoke or have quit within the past 15 years.  Fecal occult blood test (FOBT) of the stool. You may have this test every year starting at age 52.  Flexible sigmoidoscopy or colonoscopy. You may have a sigmoidoscopy every 5 years or a colonoscopy every 10 years starting at age 54.  Hepatitis C blood test.  Hepatitis B blood test.  Sexually transmitted disease (STD) testing.  Diabetes screening. This is done by checking your blood sugar (glucose) after you have not eaten for a while (fasting). You may have this done every 1-3 years.  Bone density scan. This is done to screen for osteoporosis. You may have this done starting at age 23.  Mammogram. This may be done every 1-2 years. Talk to your health care provider about how often you should have regular mammograms. Talk with your health care provider about your test results, treatment options, and if necessary, the need for more tests. Vaccines  Your health care provider may recommend certain vaccines, such as:  Influenza vaccine. This is recommended every year.  Tetanus, diphtheria, and acellular pertussis (Tdap,  Td) vaccine. You may need a Td booster every 10 years.  Zoster vaccine. You may need this after age 63.  Pneumococcal 13-valent conjugate (PCV13) vaccine. One dose is recommended after age  25.  Pneumococcal polysaccharide (PPSV23) vaccine. One dose is recommended after age 35. Talk to your health care provider about which screenings and vaccines you need and how often you need them. This information is not intended to replace advice given to you by your health care provider. Make sure you discuss any questions you have with your health care provider. Document Released: 03/18/2015 Document Revised: 11/09/2015 Document Reviewed: 12/21/2014 Elsevier Interactive Patient Education  2017 [place] Prevention in the Home Falls can cause injuries. They can happen to people of all ages. There are many things you can do to make your home safe and to help prevent falls. What can I do on the outside of my home?  Regularly fix the edges of walkways and driveways and fix any cracks.  Remove anything that might make you trip as you walk through a door, such as a raised step or threshold.  Trim any bushes or trees on the path to your home.  Use bright outdoor lighting.  Clear any walking paths of anything that might make someone trip, such as rocks or tools.  Regularly check to see if handrails are loose or broken. Make sure that both sides of any steps have handrails.  Any raised decks and porches should have guardrails on the edges.  Have any leaves, snow, or ice cleared regularly.  Use sand or salt on walking paths during winter.  Clean up any spills in your garage right away. This includes oil or grease spills. What can I do in the bathroom?  Use night lights.  Install grab bars by the toilet and in the tub and shower. Do not use towel bars as grab bars.  Use non-skid mats or decals in the tub or shower.  If you need to sit down in the shower, use a plastic, non-slip stool.  Keep the floor dry. Clean up any water that spills on the floor as soon as it happens.  Remove soap buildup in the tub or shower regularly.  Attach bath mats securely with double-sided  non-slip rug tape.  Do not have throw rugs and other things on the floor that can make you trip. What can I do in the bedroom?  Use night lights.  Make sure that you have a light by your bed that is easy to reach.  Do not use any sheets or blankets that are too big for your bed. They should not hang down onto the floor.  Have a firm chair that has side arms. You can use this for support while you get dressed.  Do not have throw rugs and other things on the floor that can make you trip. What can I do in the kitchen?  Clean up any spills right away.  Avoid walking on wet floors.  Keep items that you use a lot in easy-to-reach places.  If you need to reach something above you, use a strong step stool that has a grab bar.  Keep electrical cords out of the way.  Do not use floor polish or wax that makes floors slippery. If you must use wax, use non-skid floor wax.  Do not have throw rugs and other things on the floor that can make you trip. What can I do with my stairs?  Do not  leave any items on the stairs.  Make sure that there are handrails on both sides of the stairs and use them. Fix handrails that are broken or loose. Make sure that handrails are as long as the stairways.  Check any carpeting to make sure that it is firmly attached to the stairs. Fix any carpet that is loose or worn.  Avoid having throw rugs at the top or bottom of the stairs. If you do have throw rugs, attach them to the floor with carpet tape.  Make sure that you have a light switch at the top of the stairs and the bottom of the stairs. If you do not have them, ask someone to add them for you. What else can I do to help prevent falls?  Wear shoes that:  Do not have high heels.  Have rubber bottoms.  Are comfortable and fit you well.  Are closed at the toe. Do not wear sandals.  If you use a stepladder:  Make sure that it is fully opened. Do not climb a closed stepladder.  Make sure that both  sides of the stepladder are locked into place.  Ask someone to hold it for you, if possible.  Clearly mark and make sure that you can see:  Any grab bars or handrails.  First and last steps.  Where the edge of each step is.  Use tools that help you move around (mobility aids) if they are needed. These include:  Canes.  Walkers.  Scooters.  Crutches.  Turn on the lights when you go into a dark area. Replace any light bulbs as soon as they burn out.  Set up your furniture so you have a clear path. Avoid moving your furniture around.  If any of your floors are uneven, fix them.  If there are any pets around you, be aware of where they are.  Review your medicines with your doctor. Some medicines can make you feel dizzy. This can increase your chance of falling. Ask your doctor what other things that you can do to help prevent falls. This information is not intended to replace advice given to you by your health care provider. Make sure you discuss any questions you have with your health care provider. Document Released: 12/16/2008 Document Revised: 07/28/2015 Document Reviewed: 03/26/2014 Elsevier Interactive Patient Education  2017 [employer].

## 2020-03-11 NOTE — Progress Notes (Signed)
Subjective:   Elizabeth Vincent is a 70 y.o. female who presents for an Initial Medicare Annual Wellness Visit.  Review of Systems     Cardiac Risk Factors include: advanced age (>97men, >66 women);hypertension;dyslipidemia     Objective:    Today's Vitals   03/11/20 1414  BP: 138/68  Pulse: 68  SpO2: 99%  Weight: 133 lb 6.4 oz (60.5 kg)   Body mass index is 20.89 kg/m.  Advanced Directives 03/11/2020  Does Patient Have a Medical Advance Directive? No  Does patient want to make changes to medical advance directive? Yes (MAU/Ambulatory/Procedural Areas - Information given)    Current Medications (verified) Outpatient Encounter Medications as of 03/11/2020  Medication Sig  . amLODipine (NORVASC) 5 MG tablet TAKE ONE TABLET BY MOUTH DAILY  . Calcium-Magnesium-Vitamin D (CALCIUM 1200+D3 PO) Take by mouth.  Marland Kitchen lisinopril (ZESTRIL) 40 MG tablet Take 1 tablet (40 mg total) by mouth daily.  . Multiple Vitamins-Minerals (PRESERVISION AREDS 2+MULTI VIT PO) Take by mouth.  Marland Kitchen FLUZONE HIGH-DOSE QUADRIVALENT 0.7 ML SUSY   . SHINGRIX injection    No facility-administered encounter medications on file as of 03/11/2020.    Allergies (verified) Patient has no known allergies.   History: Past Medical History:  Diagnosis Date  . Cancer ([place])    T cell Lymphoma  . Cataract    bilateral lens implants  . Colon polyps   . Hypertension   . Osteoporosis    Past Surgical History:  Procedure Laterality Date  . BREAST BIOPSY    . CATARACT EXTRACTION, BILATERAL    . EYE SURGERY    . SKIN BIOPSY     Family History  Problem Relation Age of Onset  . Cancer Mother   . Early death Mother   . Heart disease Father   . Alcohol abuse Sister   . Cancer Maternal Grandmother   . Cancer Maternal Grandfather   . Heart disease Paternal Grandmother   . Cancer Paternal Grandfather   . Stroke Paternal Grandfather   . Colon cancer Maternal Uncle   . Rectal cancer Neg Hx   . Stomach cancer Neg Hx    . Esophageal cancer Neg Hx    Social History   Socioeconomic History  . Marital status: Single    Spouse name: Not on file  . Number of children: Not on file  . Years of education: Not on file  . Highest education level: Not on file  Occupational History  . Occupation: retired  Tobacco Use  . Smoking status: Never Smoker  . Smokeless tobacco: Never Used  Vaping Use  . Vaping Use: Never used  Substance and Sexual Activity  . Alcohol use: Yes    Comment: wine with dinner  . Drug use: Never  . Sexual activity: Not Currently  Other Topics Concern  . Not on file  Social History Narrative  . Not on file   Social Determinants of Health   Financial Resource Strain: Low Risk   . Difficulty of Paying Living Expenses: Not hard at all  Food Insecurity: No Food Insecurity  . Worried About Charity fundraiser in the Last Year: Never true  . Ran Out of Food in the Last Year: Never true  Transportation Needs: No Transportation Needs  . Lack of Transportation (Medical): No  . Lack of Transportation (Non-Medical): No  Physical Activity: Inactive  . Days of Exercise per Week: 0 days  . Minutes of Exercise per Session: 0 min  Stress: No  Stress Concern Present  . Feeling of Stress : Not at all  Social Connections: Moderately Isolated  . Frequency of Communication with Friends and Family: More than three times a week  . Frequency of Social Gatherings with Friends and Family: Twice a week  . Attends Religious Services: More than 4 times per year  . Active Member of Clubs or Organizations: No  . Attends Banker Meetings: Never  . Marital Status: Never married    Tobacco Counseling Counseling given: Not Answered   Clinical Intake:  Pre-visit preparation completed: Yes  Pain : No/denies pain     BMI - recorded: 20.89 Nutritional Status: BMI of 19-24  Normal Nutritional Risks: None Diabetes: No  How often do you need to have someone help you when you read  instructions, pamphlets, or other written materials from your doctor or pharmacy?: 1 - Never  Diabetic?No  Interpreter Needed?: No  Information entered by :: Lanier Ensign, LPN   Activities of Daily Living In your present state of health, do you have any difficulty performing the following activities: 03/11/2020 05/15/2019  Hearing? N N  Vision? N N  Difficulty concentrating or making decisions? N N  Walking or climbing stairs? N N  Dressing or bathing? N N  Doing errands, shopping? N N  Preparing Food and eating ? N -  Using the Toilet? N -  In the past six months, have you accidently leaked urine? N -  Do you have problems with loss of bowel control? N -  Managing your Medications? N -  Managing your Finances? N -  Housekeeping or managing your Housekeeping? N -  Some recent data might be hidden    Patient Care Team: Orland Mustard, MD as PCP - General (Family Medicine)  Indicate any recent Medical Services you may have received from other than [facility] providers in the past year (date may be approximate).     Assessment:   This is a routine wellness examination for [place].  Hearing/Vision screen  Hearing Screening   125Hz  250Hz  500Hz  1000Hz  2000Hz  3000Hz  4000Hz  6000Hz  8000Hz   Right ear:           Left ear:           Comments: Pt denies any hearing issues   Vision Screening Comments: Follow up annually with Dr for eye care  Dietary issues and exercise activities discussed: Current Exercise Habits: The patient does not participate in regular exercise at present  Goals    . Patient Stated     Lose weight       Depression Screen PHQ 2/9 Scores 03/11/2020 11/18/2019 03/09/2019 10/15/2018 12/25/2017  PHQ - 2 Score 0 0 0 0 0  PHQ- 9 Score - - 1 - 0    Fall Risk Fall Risk  03/11/2020 11/18/2019 03/09/2019 12/25/2017  Falls in the past year? 0 1 1 No  Number falls in past yr: 0 0 0 -  Injury with Fall? 0 - 0 -  Risk for fall due to : Impaired vision - - -  Follow  up Falls prevention discussed - - -    FALL RISK PREVENTION PERTAINING TO THE HOME:  Any stairs in or around the home? Yes  If so, are there any without handrails? No  Home free of loose throw rugs in walkways, pet beds, electrical cords, etc? Yes  Adequate lighting in your home to reduce risk of falls? Yes   ASSISTIVE DEVICES UTILIZED TO PREVENT FALLS:  Life alert? No  Use of a cane, walker or w/c? No  Grab bars in the bathroom? No  Shower chair or bench in shower? No  Elevated toilet seat or a handicapped toilet? No   TIMED UP AND GO:  Was the test performed? Yes .  Length of time to ambulate 10 feet: 10 sec.   Gait steady and fast without use of assistive device Cognitive Function:     6CIT Screen 03/11/2020  What Year? 0 points  What month? 0 points  Count back from 20 0 points  Months in reverse 0 points  Repeat phrase 0 points    Immunizations Immunization History  Administered Date(s) Administered  . Influenza, High Dose Seasonal PF 11/14/2018  . Influenza-Unspecified 11/14/2017, 12/11/2019  . PFIZER SARS-COV-2 Vaccination 04/18/2019, 05/11/2019, 12/02/2019  . Pneumococcal Polysaccharide-23 10/15/2018  . Tdap 01/29/2018  . Zoster Recombinat (Shingrix) 12/31/2019, 02/29/2020    TDAP status: Up to date  Flu Vaccine status: Up to date  Pneumococcal vaccine status: Up to date  Covid-19 vaccine status: Completed vaccines  Qualifies for Shingles Vaccine? Yes   Zostavax completed No   Shingrix Completed?: No.    Education has been provided regarding the importance of this vaccine. Patient has been advised to call insurance company to determine out of pocket expense if they have not yet received this vaccine. Advised may also receive vaccine at local pharmacy or Health Dept. Verbalized acceptance and understanding.  Screening Tests Health Maintenance  Topic Date Due  . COVID-19 Vaccine (4 - Booster for Pfizer series) 05/31/2020  . COLONOSCOPY (Pts 45-41yrs  Insurance coverage will need to be confirmed)  07/27/2020  . MAMMOGRAM  06/25/2021  . TETANUS/TDAP  01/30/2028  . INFLUENZA VACCINE  Completed  . DEXA SCAN  Completed  . Hepatitis C Screening  Completed  . PNA vac Low Risk Adult  Discontinued    Health Maintenance  There are no preventive care reminders to display for this patient.  Colorectal cancer screening: Type of screening: Colonoscopy. Completed 07/28/19. Repeat every 1 years  Mammogram status: Completed 07/09/19. Repeat every year  Bone Density status: Completed 07/09/19. Results reflect: Bone density results: OSTEOPENIA. Repeat every 2 years.    Additional Screening:  Hepatitis C Screening: Completed 01/29/18  Vision Screening: Recommended annual ophthalmology exams for early detection of glaucoma and other disorders of the eye. Is the patient up to date with their annual eye exam?  Yes  Who is the provider or what is the name of the office in which the patient attends annual eye exams? Dr Rutherford Guys   Dental Screening: Recommended annual dental exams for proper oral hygiene  Community Resource Referral / Chronic Care Management: CRR required this visit?  No   CCM required this visit?  No      Plan:     I have personally reviewed and noted the following in the patient's chart:   . Medical and social history . Use of alcohol, tobacco or illicit drugs  . Current medications and supplements . Functional ability and status . Nutritional status . Physical activity . Advanced directives . List of other physicians . Hospitalizations, surgeries, and ER visits in previous 12 months . Vitals . Screenings to include cognitive, depression, and falls . Referrals and appointments  In addition, I have reviewed and discussed with patient certain preventive protocols, quality metrics, and best practice recommendations. A written personalized care plan for preventive services as well as general preventive health  recommendations were provided to  patient.     Marzella Schlein, LPN   09/09/3734   Nurse Notes: None

## 2020-04-06 ENCOUNTER — Other Ambulatory Visit: Payer: Self-pay | Admitting: Family Medicine

## 2020-05-10 ENCOUNTER — Encounter: Payer: Self-pay | Admitting: Gastroenterology

## 2020-05-12 ENCOUNTER — Encounter: Payer: Self-pay | Admitting: Gastroenterology

## 2020-05-14 ENCOUNTER — Encounter: Payer: Self-pay | Admitting: Family Medicine

## 2020-05-17 NOTE — Telephone Encounter (Signed)
Pt was called an given detailed instructions. She voiced understanding.

## 2020-05-19 ENCOUNTER — Other Ambulatory Visit: Payer: Self-pay

## 2020-05-19 ENCOUNTER — Other Ambulatory Visit: Payer: Self-pay | Admitting: Family Medicine

## 2020-05-19 ENCOUNTER — Ambulatory Visit (INDEPENDENT_AMBULATORY_CARE_PROVIDER_SITE_OTHER): Payer: PPO | Admitting: Family Medicine

## 2020-05-19 ENCOUNTER — Ambulatory Visit (INDEPENDENT_AMBULATORY_CARE_PROVIDER_SITE_OTHER): Payer: PPO

## 2020-05-19 ENCOUNTER — Encounter: Payer: Self-pay | Admitting: Family Medicine

## 2020-05-19 VITALS — BP 140/80 | HR 81 | Temp 97.4°F | Ht 67.0 in | Wt 130.2 lb

## 2020-05-19 DIAGNOSIS — R55 Syncope and collapse: Secondary | ICD-10-CM

## 2020-05-19 DIAGNOSIS — I10 Essential (primary) hypertension: Secondary | ICD-10-CM

## 2020-05-19 DIAGNOSIS — E782 Mixed hyperlipidemia: Secondary | ICD-10-CM | POA: Diagnosis not present

## 2020-05-19 LAB — MICROALBUMIN / CREATININE URINE RATIO
Creatinine,U: 36.2 mg/dL
Microalb Creat Ratio: 1.9 mg/g (ref 0.0–30.0)
Microalb, Ur: <0.7 mg/dL (ref 0.0–1.9)

## 2020-05-19 LAB — LIPID PANEL
Cholesterol: 231 mg/dL — ABNORMAL HIGH (ref 0–200)
HDL: 62.1 mg/dL (ref >39.00)
LDL Cholesterol: 154 mg/dL — ABNORMAL HIGH (ref 0–99)
NonHDL: 169.3
Total CHOL/HDL Ratio: 4
Triglycerides: 75 mg/dL (ref 0.0–149.0)
VLDL: 15 mg/dL (ref 0.0–40.0)

## 2020-05-19 LAB — COMPREHENSIVE METABOLIC PANEL
ALT: 7 U/L (ref 0–35)
AST: 15 U/L (ref 0–37)
Albumin: 4.8 g/dL (ref 3.5–5.2)
Alkaline Phosphatase: 33 U/L — ABNORMAL LOW (ref 39–117)
BUN: 12 mg/dL (ref 6–23)
CO2: 27 mEq/L (ref 19–32)
Calcium: 9.8 mg/dL (ref 8.4–10.5)
Chloride: 90 mEq/L — ABNORMAL LOW (ref 96–112)
Creatinine, Ser: 0.81 mg/dL (ref 0.40–1.20)
GFR: 73.11 mL/min (ref >60.00)
Glucose, Bld: 76 mg/dL (ref 70–99)
Potassium: 5.4 mEq/L — ABNORMAL HIGH (ref 3.5–5.1)
Sodium: 130 mEq/L — ABNORMAL LOW (ref 135–145)
Total Bilirubin: 0.6 mg/dL (ref 0.2–1.2)
Total Protein: 7.3 g/dL (ref 6.0–8.3)

## 2020-05-19 LAB — CBC WITH DIFFERENTIAL/PLATELET
Basophils Absolute: 0 10*3/uL (ref 0.0–0.1)
Basophils Relative: 0.7 % (ref 0.0–3.0)
Eosinophils Absolute: 0 10*3/uL (ref 0.0–0.7)
Eosinophils Relative: 0.3 % (ref 0.0–5.0)
HCT: 40.2 % (ref 36.0–46.0)
Hemoglobin: 13.4 g/dL (ref 12.0–15.0)
Lymphocytes Relative: 19.2 % (ref 12.0–46.0)
Lymphs Abs: 0.9 10*3/uL (ref 0.7–4.0)
MCHC: 33.4 g/dL (ref 30.0–36.0)
MCV: 93.8 fl (ref 78.0–100.0)
Monocytes Absolute: 0.3 10*3/uL (ref 0.1–1.0)
Monocytes Relative: 6.7 % (ref 3.0–12.0)
Neutro Abs: 3.4 10*3/uL (ref 1.4–7.7)
Neutrophils Relative %: 73.1 % (ref 43.0–77.0)
Platelets: 280 10*3/uL (ref 150.0–400.0)
RBC: 4.29 Mil/uL (ref 3.87–5.11)
RDW: 13.5 % (ref 11.5–15.5)
WBC: 4.7 10*3/uL (ref 4.0–10.5)

## 2020-05-19 LAB — TSH: TSH: 1.15 u[IU]/mL (ref 0.35–4.50)

## 2020-05-19 NOTE — Patient Instructions (Signed)
For you passing out.Marland Kitchen 1) checking labs 2) checking echo of heart (looks at structure/valves) 3) checking heart monitor (make sure no electrical issue causing you to pass out) 4) increase water intake.   Hold amlodipine unless blood pressure is consistently over 150/90 then you can start this back. i'll be in touch once I have tests back!     Syncope Syncope is when you pass out (faint) for a short time. It is caused by a sudden decrease in blood flow to the brain. Signs that you may be about to pass out include:  Feeling dizzy or light-headed.  Feeling sick to your stomach (nauseous).  Seeing all white or all black.  Having cold, clammy skin. If you pass out, get help right away. Call your local emergency services (911 in the [place] Do not drive yourself to the hospital. Follow these instructions at home: Watch for any changes in your symptoms. Take these actions to stay safe and help with your symptoms: Lifestyle  Do not drive, use machinery, or play sports until your doctor says it is okay.  Do not drink alcohol.  Do not use any products that contain nicotine or tobacco, such as cigarettes and e-cigarettes. If you need help quitting, ask your doctor.  Drink enough fluid to keep your pee (urine) pale yellow. General instructions  Take over-the-counter and prescription medicines only as told by your doctor.  If you are taking blood pressure or heart medicine, sit up and stand up slowly. Spend a few minutes getting ready to sit and then stand. This can help you feel less dizzy.  Have someone stay with you until you feel stable.  If you start to feel like you might pass out, lie down right away and raise (elevate) your feet above the level of your heart. Breathe deeply and steadily. Wait until all of the symptoms are gone.  Keep all follow-up visits as told by your doctor. This is important. Get help right away if:  You have a very bad headache.  You pass out once or  more than once.  You have pain in your chest, belly, or back.  You have a very fast or uneven heartbeat (palpitations).  It hurts to breathe.  You are bleeding from your mouth or your bottom (rectum).  You have black or tarry poop (stool).  You have jerky movements that you cannot control (seizure).  You are confused.  You have trouble walking.  You are very weak.  You have vision problems. These symptoms may be an emergency. Do not wait to see if the symptoms will go away. Get medical help right away. Call your local emergency services (911 in the [place] Do not drive yourself to the hospital. Summary  Syncope is when you pass out (faint) for a short time. It is caused by a sudden decrease in blood flow to the brain.  Signs that you may be about to faint include feeling dizzy, light-headed, or sick to your stomach, seeing all white or all black, or having cold, clammy skin.  If you start to feel like you might pass out, lie down right away and raise (elevate) your feet above the level of your heart. Breathe deeply and steadily. Wait until all of the symptoms are gone. This information is not intended to replace advice given to you by your health care provider. Make sure you discuss any questions you have with your health care provider. Document Revised: 04/01/2019 Document Reviewed: 04/03/2017 Elsevier Patient Education  [place].

## 2020-05-19 NOTE — Progress Notes (Addendum)
Patient: Elizabeth Vincent MRN: [id number] DOB: 05/25/1950 PCP: Orma Flaming, MD     Subjective:  Chief Complaint  Patient presents with  . Hypertension  . Hyperlipidemia  . syncope    HPI: The patient is a 70 y.o. female who presents today for HTN and Cholesterol. She also had a fainting spell and would like to discuss. She states that she is feeling better.   Syncopal episode Happened on Friday night around 6:30pm. She was at church doing the [place] [employer]. She was sitting and started to feel light headed and she fell over to the left (syncope). People heard a thump. She did not hit her head on the floor, but had her jacket on pew and apparently her head hit her wool jacket on the pew. EMS was called her blood sugar was normal 137 and her vitals were stable. Blood pressure was low: 100/60. Hr of 60 and oxygen of 97%. She ate around 2-2:30pm. She states she had no chest pain, palpitations, leg swelling, cough. She states this has happened one other time in her life about 1.5 years ago. She held her BP pills x 1 day then emailed me. I had her hold her amlodipine, but she has continued her lisinopril. She has felt fine since this time.   -no seizure type activity, no shaking, no loss or urine/biting tongue. No confusion/fatigue.    Hypertension: Here for follow up of hypertension.  Currently on lisinopril 40mg /day. Takes medication as prescribed and denies any side effects. Exercise includes walking. Weight has been stable. Denies any chest pain, headaches, shortness of breath, vision changes, swelling in lower extremities.   I had her hold her norvasc after her syncopal episode so she has not taken this in a few days.   Hyperlipidemia Has declined any medication. ascvd risk: 15.6%  Review of Systems  Constitutional: Negative for chills, fatigue and fever.  HENT: Negative for dental problem, ear pain, hearing loss and trouble swallowing.   Eyes: Negative for visual  disturbance.  Respiratory: Negative for cough, chest tightness and shortness of breath.   Cardiovascular: Negative for chest pain, palpitations and leg swelling.  Gastrointestinal: Negative for abdominal pain, blood in stool, diarrhea and nausea.  Endocrine: Negative for cold intolerance, polydipsia, polyphagia and polyuria.  Genitourinary: Negative for dysuria and hematuria.  Musculoskeletal: Negative for arthralgias.  Skin: Negative for rash.  Neurological: Negative for dizziness and headaches.  Psychiatric/Behavioral: Negative for dysphoric mood and sleep disturbance. The patient is not nervous/anxious.     Allergies Patient has No Known Allergies.  Past Medical History Patient  has a past medical history of Cancer [employer]), Cataract, Colon polyps, Hypertension, and Osteoporosis.  Surgical History Patient  has a past surgical history that includes Eye surgery; Cataract extraction, bilateral; Skin biopsy; and Breast biopsy.  Family History Pateint's family history includes Alcohol abuse in her sister; Cancer in her maternal grandfather, maternal grandmother, mother, and paternal grandfather; Colon cancer in her maternal uncle; Early death in her mother; Heart disease in her father and paternal grandmother; Stroke in her paternal grandfather.  Social History Patient  reports that she has never smoked. She has never used smokeless tobacco. She reports current alcohol use. She reports that she does not use drugs.    Objective: Vitals:   05/19/20 0827  BP: 140/80  Pulse: 81  Temp: (!) 97.4 F (36.3 C)  TempSrc: Temporal  SpO2: 100%  Weight: 130 lb 3.2 oz (59.1 kg)  Height: 5\' 7"  (1.702 m)  Body mass index is 20.39 kg/m.  Physical Exam Vitals reviewed.  Constitutional:      Appearance: Normal appearance. She is well-developed and normal weight.  HENT:     Head: Normocephalic and atraumatic.     Right Ear: Tympanic membrane, ear canal and external ear normal.     Left  Ear: Tympanic membrane, ear canal and external ear normal.     Nose: Nose normal.     Mouth/Throat:     Mouth: Mucous membranes are moist.  Eyes:     Extraocular Movements: Extraocular movements intact.     Conjunctiva/sclera: Conjunctivae normal.     Pupils: Pupils are equal, round, and reactive to light.  Neck:     Thyroid: No thyromegaly.     Vascular: No carotid bruit.  Cardiovascular:     Rate and Rhythm: Normal rate and regular rhythm.     Pulses: Normal pulses.     Heart sounds: Murmur (soft systolic murmur ) heard.    Pulmonary:     Effort: Pulmonary effort is normal.     Breath sounds: Normal breath sounds.  Abdominal:     General: Abdomen is flat. Bowel sounds are normal. There is no distension.     Palpations: Abdomen is soft.     Tenderness: There is no abdominal tenderness.  Musculoskeletal:     Cervical back: Normal range of motion and neck supple.  Lymphadenopathy:     Cervical: No cervical adenopathy.  Skin:    General: Skin is warm and dry.     Capillary Refill: Capillary refill takes less than 2 seconds.     Findings: No rash.  Neurological:     General: No focal deficit present.     Mental Status: She is alert and oriented to person, place, and time.     Cranial Nerves: No cranial nerve deficit.     Coordination: Coordination normal.     Deep Tendon Reflexes: Reflexes normal.  Psychiatric:        Mood and Affect: Mood normal.        Behavior: Behavior normal.    Ekg: nsr with rate of 71. Compared to past ekg.      Assessment/plan: 1. Syncope, unspecified syncope type -checking labs, echo, monitor. She does have a new murmur that was very quiet I heard last visit 6 months ago. Has not changed much.  -orthostatics wnl, but encouraged her to drink more water.  -holding amlodipine. Continue lisinopril and log.  -make sure she takes her time getting up.  -will f/u on above mentioned tests. Precautions given and recommended ER if this happens again.    2. Essential hypertension To goal today. Holding amlodipine, but parameters given if her readings at home start to creep back up. She is to start back the amlodipine if BP >150/90. Continue 40mg  of lisinopril -routine labs today   3. Mixed hyperlipidemia Routine fasting labs today. She has declined treatment in the past and have had long discussion regarding this.     This visit occurred during the SARS-CoV-2 public health emergency.  Safety protocols were in place, including screening questions prior to the visit, additional usage of staff PPE, and extensive cleaning of exam room while observing appropriate contact time as indicated for disinfecting solutions.     Return in about 6 months (around 11/19/2020) for htn.   Orma Flaming, MD [place]   05/19/2020

## 2020-05-20 ENCOUNTER — Other Ambulatory Visit: Payer: Self-pay | Admitting: Family Medicine

## 2020-05-20 DIAGNOSIS — E871 Hypo-osmolality and hyponatremia: Secondary | ICD-10-CM

## 2020-05-23 ENCOUNTER — Other Ambulatory Visit (INDEPENDENT_AMBULATORY_CARE_PROVIDER_SITE_OTHER): Payer: PPO

## 2020-05-23 ENCOUNTER — Other Ambulatory Visit: Payer: Self-pay

## 2020-05-23 DIAGNOSIS — E871 Hypo-osmolality and hyponatremia: Secondary | ICD-10-CM | POA: Diagnosis not present

## 2020-05-23 LAB — BASIC METABOLIC PANEL
BUN: 12 mg/dL (ref 6–23)
CO2: 29 mEq/L (ref 19–32)
Calcium: 9.7 mg/dL (ref 8.4–10.5)
Chloride: 90 mEq/L — ABNORMAL LOW (ref 96–112)
Creatinine, Ser: 0.84 mg/dL (ref 0.40–1.20)
GFR: 69.99 mL/min (ref >60.00)
Glucose, Bld: 101 mg/dL — ABNORMAL HIGH (ref 70–99)
Potassium: 4.3 mEq/L (ref 3.5–5.1)
Sodium: 128 mEq/L — ABNORMAL LOW (ref 135–145)

## 2020-05-27 ENCOUNTER — Other Ambulatory Visit: Payer: Self-pay | Admitting: Family Medicine

## 2020-05-27 DIAGNOSIS — E871 Hypo-osmolality and hyponatremia: Secondary | ICD-10-CM

## 2020-06-02 ENCOUNTER — Other Ambulatory Visit: Payer: Self-pay

## 2020-06-02 ENCOUNTER — Ambulatory Visit (HOSPITAL_COMMUNITY): Payer: PPO | Attending: Cardiovascular Disease

## 2020-06-02 DIAGNOSIS — R55 Syncope and collapse: Secondary | ICD-10-CM | POA: Diagnosis not present

## 2020-06-02 LAB — ECHOCARDIOGRAM COMPLETE
Area-P 1/2: 3.33 cm2
S' Lateral: 2.8 cm

## 2020-06-03 DIAGNOSIS — R55 Syncope and collapse: Secondary | ICD-10-CM | POA: Diagnosis not present

## 2020-06-27 ENCOUNTER — Other Ambulatory Visit: Payer: Self-pay

## 2020-06-27 ENCOUNTER — Other Ambulatory Visit (INDEPENDENT_AMBULATORY_CARE_PROVIDER_SITE_OTHER): Payer: PPO

## 2020-06-27 DIAGNOSIS — E871 Hypo-osmolality and hyponatremia: Secondary | ICD-10-CM | POA: Diagnosis not present

## 2020-06-27 LAB — BASIC METABOLIC PANEL
BUN: 18 mg/dL (ref 6–23)
CO2: 28 mEq/L (ref 19–32)
Calcium: 9 mg/dL (ref 8.4–10.5)
Chloride: 102 mEq/L (ref 96–112)
Creatinine, Ser: 0.81 mg/dL (ref 0.40–1.20)
GFR: 73.06 mL/min (ref >60.00)
Glucose, Bld: 85 mg/dL (ref 70–99)
Potassium: 4 mEq/L (ref 3.5–5.1)
Sodium: 136 mEq/L (ref 135–145)

## 2020-07-26 ENCOUNTER — Other Ambulatory Visit: Payer: Self-pay | Admitting: Family Medicine

## 2020-07-26 DIAGNOSIS — Z1231 Encounter for screening mammogram for malignant neoplasm of breast: Secondary | ICD-10-CM

## 2020-07-28 ENCOUNTER — Other Ambulatory Visit: Payer: Self-pay

## 2020-07-28 ENCOUNTER — Ambulatory Visit
Admission: RE | Admit: 2020-07-28 | Discharge: 2020-07-28 | Disposition: A | Discharge status: Home / Self Care | Payer: PPO | Source: Ambulatory Visit

## 2020-07-28 DIAGNOSIS — Z1231 Encounter for screening mammogram for malignant neoplasm of breast: Secondary | ICD-10-CM

## 2020-08-15 ENCOUNTER — Other Ambulatory Visit: Payer: Self-pay

## 2020-08-15 ENCOUNTER — Ambulatory Visit (AMBULATORY_SURGERY_CENTER): Payer: PPO | Admitting: *Deleted

## 2020-08-15 VITALS — Ht 67.0 in | Wt 133.0 lb

## 2020-08-15 DIAGNOSIS — Z86010 Personal history of colonic polyps: Secondary | ICD-10-CM

## 2020-08-15 NOTE — Progress Notes (Signed)

## 2020-08-23 ENCOUNTER — Encounter: Payer: Self-pay | Admitting: Gastroenterology

## 2020-08-29 ENCOUNTER — Encounter: Payer: Self-pay | Admitting: Gastroenterology

## 2020-08-29 ENCOUNTER — Ambulatory Visit (AMBULATORY_SURGERY_CENTER): Payer: PPO | Admitting: Gastroenterology

## 2020-08-29 ENCOUNTER — Other Ambulatory Visit: Payer: Self-pay

## 2020-08-29 VITALS — BP 129/68 | HR 61 | Temp 97.0°F | Resp 13 | Ht 67.0 in | Wt 133.0 lb

## 2020-08-29 DIAGNOSIS — D123 Benign neoplasm of transverse colon: Secondary | ICD-10-CM | POA: Diagnosis not present

## 2020-08-29 DIAGNOSIS — I10 Essential (primary) hypertension: Secondary | ICD-10-CM | POA: Diagnosis not present

## 2020-08-29 DIAGNOSIS — Z86010 Personal history of colonic polyps: Secondary | ICD-10-CM

## 2020-08-29 DIAGNOSIS — D122 Benign neoplasm of ascending colon: Secondary | ICD-10-CM

## 2020-08-29 DIAGNOSIS — D120 Benign neoplasm of cecum: Secondary | ICD-10-CM | POA: Diagnosis not present

## 2020-08-29 DIAGNOSIS — D125 Benign neoplasm of sigmoid colon: Secondary | ICD-10-CM

## 2020-08-29 HISTORY — PX: COLONOSCOPY: SHX174

## 2020-08-29 MED ORDER — SODIUM CHLORIDE 0.9 % IV SOLN: 500.0000 mL | Freq: Once | INTRAVENOUS | Status: DC

## 2020-08-29 NOTE — Progress Notes (Signed)
Report to PACU, RN, vss, BBS= Clear.  

## 2020-08-29 NOTE — Progress Notes (Signed)
Called to room to assist during endoscopic procedure.  Patient ID and intended procedure confirmed with present staff. Received instructions for my participation in the procedure from the performing physician.  

## 2020-08-29 NOTE — Progress Notes (Signed)
Pt's states no medical or surgical changes since previsit or office visit.   Vitals EW

## 2020-08-29 NOTE — Patient Instructions (Signed)
YOU HAD AN ENDOSCOPIC PROCEDURE TODAY AT THE [facility]:   Refer to the procedure report that was given to you for any specific questions about what was found during the examination.  If the procedure report does not answer your questions, please call your gastroenterologist to clarify.  If you requested that your care partner not be given the details of your procedure findings, then the procedure report has been included in a sealed envelope for you to review at your convenience later.  YOU SHOULD EXPECT: Some feelings of bloating in the abdomen. Passage of more gas than usual.  Walking can help get rid of the air that was put into your GI tract during the procedure and reduce the bloating. If you had a lower endoscopy (such as a colonoscopy or flexible sigmoidoscopy) you may notice spotting of blood in your stool or on the toilet paper. If you underwent a bowel prep for your procedure, you may not have a normal bowel movement for a few days.  Please Note:  You might notice some irritation and congestion in your nose or some drainage.  This is from the oxygen used during your procedure.  There is no need for concern and it should clear up in a day or so.  SYMPTOMS TO REPORT IMMEDIATELY:   Following lower endoscopy (colonoscopy or flexible sigmoidoscopy):  Excessive amounts of blood in the stool  Significant tenderness or worsening of abdominal pains  Swelling of the abdomen that is new, acute  Fever of 100F or higher   Following upper endoscopy (EGD)  Vomiting of blood or coffee ground material  New chest pain or pain under the shoulder blades  Painful or persistently difficult swallowing  New shortness of breath  Fever of 100F or higher  Black, tarry-looking stools  For urgent or emergent issues, a gastroenterologist can be reached at any hour by calling [phone]. Do not use MyChart messaging for urgent concerns.    DIET:  We do recommend a small meal at first, but  then you may proceed to your regular diet.  Drink plenty of fluids but you should avoid alcoholic beverages for 24 hours.  ACTIVITY:  You should plan to take it easy for the rest of today and you should NOT DRIVE or use heavy machinery until tomorrow (because of the sedation medicines used during the test).    FOLLOW UP: Our staff will call the number listed on your records 48-72 hours following your procedure to check on you and address any questions or concerns that you may have regarding the information given to you following your procedure. If we do not reach you, we will leave a message.  We will attempt to reach you two times.  During this call, we will ask if you have developed any symptoms of COVID 19. If you develop any symptoms (ie: fever, flu-like symptoms, shortness of breath, cough etc.) before then, please call [phone].  If you test positive for Covid 19 in the 2 weeks post procedure, please call and report this information to us.    If any biopsies were taken you will be contacted by phone or by letter within the next 1-3 weeks.  Please call us at [phone] if you have not heard about the biopsies in 3 weeks.    SIGNATURES/CONFIDENTIALITY: You and/or your care partner have signed paperwork which will be entered into your electronic medical record.  These signatures attest to the fact that that the information above on   your After Visit Summary has been reviewed and is understood.  Full responsibility of the confidentiality of this discharge information lies with you and/or your care-partner. 

## 2020-08-29 NOTE — Op Note (Signed)
[place] Patient Name: Elizabeth Vincent Procedure Date: 08/29/2020 10:34 AM MRN: [id number] Endoscopist: Thornton Park MD, MD Age: 70 Referring MD:  Date of Birth: 23-Sep-1950 Gender: Female Account #: [id number] Procedure:                Colonoscopy Indications:              Surveillance: History of piecemeal removal adenoma                            on last colonoscopy (< 3 yrs)                           Large, flat sessile serrated adenoma removed in a                            piecemeal fashion on colonoscopy 07/28/19.                            Surveillance recommended in 6 months. Medicines:                Monitored Anesthesia Care Procedure:                Pre-Anesthesia Assessment:                           - Prior to the procedure, a History and Physical                            was performed, and patient medications and                            allergies were reviewed. The patient's tolerance of                            previous anesthesia was also reviewed. The risks                            and benefits of the procedure and the sedation                            options and risks were discussed with the patient.                            All questions were answered, and informed consent                            was obtained. Prior Anticoagulants: The patient has                            taken no previous anticoagulant or antiplatelet                            agents. ASA Grade Assessment: II - A patient with  mild systemic disease. After reviewing the risks                            and benefits, the patient was deemed in                            satisfactory condition to undergo the procedure.                           After obtaining informed consent, the colonoscope                            was passed under direct vision. Throughout the                            procedure, the patient's blood pressure, pulse,  and                            oxygen saturations were monitored continuously. The                            CF [id number] #[id number] was introduced through the anus                            and advanced to the the cecum, identified by                            appendiceal orifice and ileocecal valve. The                            colonoscopy was performed with moderate difficulty                            due to a redundant colon and a tortuous colon.                            Successful completion of the procedure was aided by                            applying abdominal pressure. The patient tolerated                            the procedure well. The quality of the bowel                            preparation was good. The ileocecal valve,                            appendiceal orifice, and rectum were photographed. Scope In: 11:03:19 AM Scope Out: 11:36:05 AM Scope Withdrawal Time: 0 hours 24 minutes 15 seconds  Total Procedure Duration: 0 hours 32 minutes 46 seconds  Findings:                 Non-bleeding external and internal hemorrhoids were  found.                           Multiple small and large-mouthed diverticula were                            found in the sigmoid colon and descending colon.                           Two sessile polyps were found in the sigmoid colon                            and transverse colon. The polyps were 2 mm in size.                            These polyps were removed with a cold snare.                            Resection and retrieval were complete. Estimated                            blood loss was minimal.                           A 15 mm polyp was found in the proximal ascending                            colon. The polyp was located one fold from the IC                            valve and extended to the backside of the fold. The                            polyp was carpet-like. The polyp was removed with a                             cold snare in a piecemeal fashion. There were some                            residual pieces left on the back side of the polyp                            that were removed with cold forceps. Resection and                            retrieval appeared to be complete but the polyp                            margins were altered due to the piecemeal                            technique. Estimated blood loss was minimal.  A 2 mm polyp was found in the cecum. The polyp was                            flat. The polyp was removed with a cold snare.                            Resection and retrieval were complete. Estimated                            blood loss was minimal.                           The exam was otherwise without abnormality on                            direct and retroflexion views. Complications:            No immediate complications. Estimated blood loss:                            Minimal. Estimated Blood Loss:     Estimated blood loss was minimal. Impression:               - Non-bleeding external and internal hemorrhoids.                           - Diverticulosis in the sigmoid colon and in the                            descending colon.                           - Two 2 mm polyps in the sigmoid colon and in the                            transverse colon, removed with a cold snare.                            Resected and retrieved.                           - One 15 mm polyp in the proximal ascending colon,                            removed with a cold snare. Resected and retrieved.                           - One 2 mm polyp in the cecum, removed with a cold                            snare. Resected and retrieved.                           - The examination was otherwise normal on direct  and retroflexion views. Recommendation:           - Patient has a contact number available for                             emergencies. The signs and symptoms of potential                            delayed complications were discussed with the                            patient. Return to normal activities tomorrow.                            Written discharge instructions were provided to the                            patient.                           - Resume previous diet.                           - Continue present medications.                           - Await pathology results.                           - Repeat colonoscopy in 1 year for surveillance.                           - Emerging evidence supports eating a diet of                            fruits, vegetables, grains, calcium, and yogurt                            while reducing red meat and alcohol may reduce the                            risk of colon cancer.                           - Thank you for allowing me to be involved in your                            colon cancer prevention. Thornton Park MD, MD 08/29/2020 11:53:26 AM This report has been signed electronically.

## 2020-08-31 ENCOUNTER — Telehealth: Payer: Self-pay

## 2020-08-31 NOTE — Telephone Encounter (Signed)
  Follow up Call-  Call back number 08/29/2020 07/28/2019 12/09/2018  Post procedure Call Back phone  # [phone]  Permission to leave phone message Yes Yes Yes  Some recent data might be hidden     Patient questions:  Do you have a fever, pain , or abdominal swelling? No. Pain Score  0 *  Have you tolerated food without any problems? Yes.    Have you been able to return to your normal activities? Yes.    Do you have any questions about your discharge instructions: Diet   No. Medications  No. Follow up visit  No.  Do you have questions or concerns about your Care? No.  Actions: * If pain score is 4 or above: No action needed, pain <4.

## 2020-09-19 ENCOUNTER — Telehealth: Payer: Self-pay

## 2020-09-19 NOTE — Telephone Encounter (Signed)
Kathi Simpers a message to have pt Wellness visit r/s here at the [facility] as pt has TOC to Dr. Sharlet Salina.

## 2020-10-11 ENCOUNTER — Encounter: Payer: Self-pay | Admitting: Gastroenterology

## 2020-10-19 ENCOUNTER — Encounter: Payer: Self-pay | Admitting: Internal Medicine

## 2020-11-21 ENCOUNTER — Ambulatory Visit: Payer: PPO | Admitting: Family Medicine

## 2020-12-28 ENCOUNTER — Encounter: Payer: Self-pay | Admitting: Family Medicine

## 2020-12-28 ENCOUNTER — Ambulatory Visit (INDEPENDENT_AMBULATORY_CARE_PROVIDER_SITE_OTHER): Payer: PPO | Admitting: Family Medicine

## 2020-12-28 ENCOUNTER — Other Ambulatory Visit: Payer: Self-pay

## 2020-12-28 VITALS — BP 148/77 | HR 69 | Temp 97.3°F | Ht 67.0 in | Wt 136.2 lb

## 2020-12-28 DIAGNOSIS — H109 Unspecified conjunctivitis: Secondary | ICD-10-CM | POA: Diagnosis not present

## 2020-12-28 DIAGNOSIS — J309 Allergic rhinitis, unspecified: Secondary | ICD-10-CM | POA: Insufficient documentation

## 2020-12-28 DIAGNOSIS — I10 Essential (primary) hypertension: Secondary | ICD-10-CM

## 2020-12-28 MED ORDER — AZELASTINE HCL 0.05 % OP SOLN: 1.0000 [drp] | Freq: Two times a day (BID) | OPHTHALMIC | 12 refills | Status: DC

## 2020-12-28 NOTE — Patient Instructions (Signed)
It was very nice to see you today!  You have conjunctivitis.  Please start the eyedrops.  Let me know if your symptoms worsen or if you develop any new symptoms such as pus or severe pain.   Take care, Dr Jerline Pain  PLEASE NOTE:  If you had any lab tests please let us know if you have not heard back within a few days. You may see your results on mychart before we have a chance to review them but we will give you a call once they are reviewed by [place]. If we ordered any referrals today, please let us know if you have not heard from their office within the next week.   Please try these tips to maintain a healthy lifestyle:  Eat at least 3 REAL meals and 1-2 snacks per day.  Aim for no more than 5 hours between eating.  If you eat breakfast, please do so within one hour of getting up.   Each meal should contain half fruits/vegetables, one quarter protein, and one quarter carbs (no bigger than a computer mouse)  Cut down on sweet beverages. This includes juice, soda, and sweet tea.   Drink at least 1 glass of water with each meal and aim for at least 8 glasses per day  Exercise at least 150 minutes every week.

## 2020-12-28 NOTE — Assessment & Plan Note (Signed)
Mild flare.  Continue current treatment plan.  She is avoiding over-the-counter decongestants due to concern of raising her blood pressure.

## 2020-12-28 NOTE — Progress Notes (Signed)
   Elizabeth Vincent is a 70 y.o. female who presents today for an office visit.  Assessment/Plan:  New/Acute Problems: Conjunctivitis  Moderate follow-up.  No signs of bacterial infection.  Likely allergic given length of symptoms.  We will start topical azelastine drops.  Discussed course of prednisone however deferred for now.  She will let me know if not proving over the next several days.  Discussed return precautions.  Chronic Problems Addressed Today: Essential hypertension Slightly elevated today though at goal per JNC 8.  We will continue current dose lisinopril 40 mg daily.  Allergic rhinitis Mild flare.  Continue current treatment plan.  She is avoiding over-the-counter decongestants due to concern of raising her blood pressure.    Subjective:  HPI:  CC of the Pt is red and puffy eyes, stating this happened ~2 weeks ago. During this period of time she mentioned having itchy sinuses, which was followed up in a couple of days by the red and puffy eyes.   It had resolved temporarily several days ago, but then returned in a worsened state. Additionally, she states that she has not been having the usual symptoms she has when she is dealing with a cold. She denies sneezing, pain, or changes in vision. She admits that most of the time her eyes feel dry and gritty, but are occasionally leaky. She denies knowing anyone around her who is currently or recently sick.   She states that she has been experiencing occasional ringing in her ears recently, but attributes it to cerumen buildup.        Objective:  Physical Exam: BP (!) 148/77   Pulse 69   Temp (!) 97.3 F (36.3 C) (Temporal)   Ht 5\' 7"  (1.702 m)   Wt 136 lb 3.2 oz (61.8 kg)   LMP  (LMP Unknown)   SpO2 98%   BMI 21.33 kg/m   Gen: No acute distress, resting comfortably HEENT: Bilateral conjunctiva erythematous.  Watery discharge noted.  No purulent discharge.  TMs with clear effusion.  Nasal mucosa erythematous and boggy  bilaterally with clear discharge.  OP erythematous. CV: Regular rate and rhythm with no murmurs appreciated Pulm: Normal work of breathing, clear to auscultation bilaterally with no crackles, wheezes, or rhonchi Neuro: Grossly normal, moves all extremities Psych: Normal affect and thought content      I,Jordan Kelly,acting as a scribe for Dimas Chyle, MD.,have documented all relevant documentation on the behalf of Dimas Chyle, MD,as directed by  Dimas Chyle, MD while in the presence of Dimas Chyle, MD.  I, Dimas Chyle, MD, have reviewed all documentation for this visit. The documentation on 12/28/20 for the exam, diagnosis, procedures, and orders are all accurate and complete.  Algis Greenhouse. Jerline Pain, MD 12/28/2020 8:45 AM

## 2020-12-28 NOTE — Assessment & Plan Note (Signed)
Slightly elevated today though at goal per JNC 8.  We will continue current dose lisinopril 40 mg daily.

## 2021-01-16 DIAGNOSIS — H04123 Dry eye syndrome of bilateral lacrimal glands: Secondary | ICD-10-CM | POA: Diagnosis not present

## 2021-01-16 DIAGNOSIS — Z961 Presence of intraocular lens: Secondary | ICD-10-CM | POA: Diagnosis not present

## 2021-01-16 DIAGNOSIS — H52203 Unspecified astigmatism, bilateral: Secondary | ICD-10-CM | POA: Diagnosis not present

## 2021-01-16 DIAGNOSIS — H524 Presbyopia: Secondary | ICD-10-CM | POA: Diagnosis not present

## 2021-02-08 ENCOUNTER — Encounter: Payer: Self-pay | Admitting: Internal Medicine

## 2021-02-08 ENCOUNTER — Ambulatory Visit (INDEPENDENT_AMBULATORY_CARE_PROVIDER_SITE_OTHER): Payer: PPO | Admitting: Internal Medicine

## 2021-02-08 ENCOUNTER — Other Ambulatory Visit: Payer: Self-pay

## 2021-02-08 VITALS — BP 120/78 | HR 66 | Resp 18 | Ht 67.0 in | Wt 133.6 lb

## 2021-02-08 DIAGNOSIS — E782 Mixed hyperlipidemia: Secondary | ICD-10-CM | POA: Diagnosis not present

## 2021-02-08 DIAGNOSIS — M810 Age-related osteoporosis without current pathological fracture: Secondary | ICD-10-CM | POA: Diagnosis not present

## 2021-02-08 DIAGNOSIS — C84A0 Cutaneous T-cell lymphoma, unspecified, unspecified site: Secondary | ICD-10-CM | POA: Diagnosis not present

## 2021-02-08 DIAGNOSIS — I10 Essential (primary) hypertension: Secondary | ICD-10-CM

## 2021-02-08 DIAGNOSIS — Z0000 Encounter for general adult medical examination without abnormal findings: Secondary | ICD-10-CM | POA: Diagnosis not present

## 2021-02-08 LAB — CBC
HCT: 41.5 % (ref 36.0–46.0)
Hemoglobin: 13.6 g/dL (ref 12.0–15.0)
MCHC: 32.9 g/dL (ref 30.0–36.0)
MCV: 94.4 fl (ref 78.0–100.0)
Platelets: 352 10*3/uL (ref 150.0–400.0)
RBC: 4.39 Mil/uL (ref 3.87–5.11)
RDW: 13.7 % (ref 11.5–15.5)
WBC: 4.6 10*3/uL (ref 4.0–10.5)

## 2021-02-08 LAB — LIPID PANEL
Cholesterol: 243 mg/dL — ABNORMAL HIGH (ref 0–200)
HDL: 78.9 mg/dL (ref >39.00)
LDL Cholesterol: 149 mg/dL — ABNORMAL HIGH (ref 0–99)
NonHDL: 164.55
Total CHOL/HDL Ratio: 3
Triglycerides: 76 mg/dL (ref 0.0–149.0)
VLDL: 15.2 mg/dL (ref 0.0–40.0)

## 2021-02-08 LAB — COMPREHENSIVE METABOLIC PANEL
ALT: 9 U/L (ref 0–35)
AST: 18 U/L (ref 0–37)
Albumin: 4.7 g/dL (ref 3.5–5.2)
Alkaline Phosphatase: 28 U/L — ABNORMAL LOW (ref 39–117)
BUN: 18 mg/dL (ref 6–23)
CO2: 33 mEq/L — ABNORMAL HIGH (ref 19–32)
Calcium: 10.7 mg/dL — ABNORMAL HIGH (ref 8.4–10.5)
Chloride: 97 mEq/L (ref 96–112)
Creatinine, Ser: 0.85 mg/dL (ref 0.40–1.20)
GFR: 68.65 mL/min (ref >60.00)
Glucose, Bld: 93 mg/dL (ref 70–99)
Potassium: 5.1 mEq/L (ref 3.5–5.1)
Sodium: 136 mEq/L (ref 135–145)
Total Bilirubin: 0.5 mg/dL (ref 0.2–1.2)
Total Protein: 7.6 g/dL (ref 6.0–8.3)

## 2021-02-08 NOTE — Assessment & Plan Note (Signed)
BP at goal on lisinopril 40 mg daily. Checking CMP and adjust as needed. No side effects.

## 2021-02-08 NOTE — Progress Notes (Signed)
   Subjective:   Patient ID: Elizabeth Vincent, female    DOB: 1950/06/10, 70 y.o.   MRN: [id number]  HPI The patient is a 70 YO female coming in for [facility] and physical.  PMH, [place], social history reviewed and updated  Review of Systems  Constitutional: Negative.   HENT: Negative.    Eyes: Negative.   Respiratory:  Negative for cough, chest tightness and shortness of breath.   Cardiovascular:  Negative for chest pain, palpitations and leg swelling.  Gastrointestinal:  Negative for abdominal distention, abdominal pain, constipation, diarrhea, nausea and vomiting.  Musculoskeletal:  Positive for arthralgias.  Skin: Negative.   Neurological: Negative.   Psychiatric/Behavioral: Negative.     Objective:  Physical Exam Constitutional:      Appearance: She is well-developed.  HENT:     Head: Normocephalic and atraumatic.  Cardiovascular:     Rate and Rhythm: Normal rate and regular rhythm.  Pulmonary:     Effort: Pulmonary effort is normal. No respiratory distress.     Breath sounds: Normal breath sounds. No wheezing or rales.  Abdominal:     General: Bowel sounds are normal. There is no distension.     Palpations: Abdomen is soft.     Tenderness: There is no abdominal tenderness. There is no rebound.  Musculoskeletal:        General: Tenderness present.     Cervical back: Normal range of motion.  Skin:    General: Skin is warm and dry.  Neurological:     Mental Status: She is alert and oriented to person, place, and time.     Coordination: Coordination normal.    Vitals:   02/08/21 0843  BP: 120/78  Pulse: 66  Resp: 18  SpO2: 94%  Weight: 133 lb 9.6 oz (60.6 kg)  Height: 5\' 7"  (1.702 m)    This visit occurred during the SARS-CoV-2 public health emergency.  Safety protocols were in place, including screening questions prior to the visit, additional usage of staff PPE, and extensive cleaning of exam room while observing appropriate contact time as indicated for  disinfecting solutions.   Assessment & Plan:

## 2021-02-08 NOTE — Assessment & Plan Note (Signed)
Flu shot up to date. Covid-19 up to date. Pneumonia has had 23. Shingrix complete. Tetanus due 2029. Colonoscopy due 2023. Mammogram due 2024, pap smear aged out and dexa due 2023 ordered. Counseled about sun safety and mole surveillance. Counseled about the dangers of distracted driving. Given 10 year screening recommendations.

## 2021-02-08 NOTE — Assessment & Plan Note (Signed)
Checking lipid panel and adjust as needed. Not on medication.  

## 2021-02-08 NOTE — Patient Instructions (Signed)
We will recheck the bone density in April.

## 2021-02-08 NOTE — Assessment & Plan Note (Signed)
Last treatment in 2000 and she is not having any new skin lesions or itching. Reminded about skin cancer prevention.

## 2021-02-08 NOTE — Assessment & Plan Note (Signed)
Not on current treatment. Reviewed treatment options and we have planned to recheck DEXA April 2023 since due and if stable or worse we will initiate treatment. Encouraged exercise.

## 2021-03-17 ENCOUNTER — Ambulatory Visit (INDEPENDENT_AMBULATORY_CARE_PROVIDER_SITE_OTHER): Payer: PPO

## 2021-03-17 ENCOUNTER — Other Ambulatory Visit: Payer: Self-pay

## 2021-03-17 ENCOUNTER — Ambulatory Visit: Payer: PPO

## 2021-03-17 VITALS — BP 126/80 | HR 62 | Temp 97.6°F | Ht 67.0 in | Wt 135.8 lb

## 2021-03-17 DIAGNOSIS — Z0000 Encounter for general adult medical examination without abnormal findings: Secondary | ICD-10-CM

## 2021-03-17 DIAGNOSIS — Z23 Encounter for immunization: Secondary | ICD-10-CM

## 2021-03-17 NOTE — Patient Instructions (Signed)
Ms. Padin , Thank you for taking time to come for your Medicare Wellness Visit. I appreciate your ongoing commitment to your health goals. Please review the following plan we discussed and let me know if I can assist you in the future.   Screening recommendations/referrals: Colonoscopy: 08/29/2020; due every year Mammogram: 07/28/2020; due every 1-2 years Bone Density: 06/26/2019; scheduled for 06/27/2021 Recommended yearly ophthalmology/optometry visit for glaucoma screening and checkup Recommended yearly dental visit for hygiene and checkup  Vaccinations: Influenza vaccine: 01/20/2021 Pneumococcal vaccine: 10/15/2018, 03/17/2021 Tdap vaccine: 01/29/2018; due every 10 years Shingles vaccine: 12/31/2019, 02/29/2020   Covid-19: 04/18/2019, 05/11/2019, 12/02/2019, 06/11/2020, 11/22/2020  Advanced directives: Advance directive discussed with you today. Even though you declined this today please call our office should you change your mind and we can give you the proper paperwork for you to fill out.   Conditions/risks identified: Yes; Client understands the importance of follow-up with providers by attending scheduled visits and discussed goals to eat healthier, increase physical activity, exercise the brain, socialize more, get enough sleep and make time for laughter.  Next appointment: Please schedule your next Medicare Wellness Visit with your Nurse Health Advisor in 1 year by calling [phone].   Preventive Care 62 Years and Older, Female Preventive care refers to lifestyle choices and visits with your health care provider that can promote health and wellness. What does preventive care include? A yearly physical exam. This is also called an annual well check. Dental exams once or twice a year. Routine eye exams. Ask your health care provider how often you should have your eyes checked. Personal lifestyle choices, including: Daily care of your teeth and gums. Regular physical  activity. Eating a healthy diet. Avoiding tobacco and drug use. Limiting alcohol use. Practicing safe sex. Taking low-dose aspirin every day. Taking vitamin and mineral supplements as recommended by your health care provider. What happens during an annual well check? The services and screenings done by your health care provider during your annual well check will depend on your age, overall health, lifestyle risk factors, and family history of disease. Counseling  Your health care provider may ask you questions about your: Alcohol use. Tobacco use. Drug use. Emotional well-being. Home and relationship well-being. Sexual activity. Eating habits. History of falls. Memory and ability to understand (cognition). Work and work Statistician. Reproductive health. Screening  You may have the following tests or measurements: Height, weight, and BMI. Blood pressure. Lipid and cholesterol levels. These may be checked every 5 years, or more frequently if you are over 59 years old. Skin check. Lung cancer screening. You may have this screening every year starting at age 41 if you have a 30-pack-year history of smoking and currently smoke or have quit within the past 15 years. Fecal occult blood test (FOBT) of the stool. You may have this test every year starting at age 52. Flexible sigmoidoscopy or colonoscopy. You may have a sigmoidoscopy every 5 years or a colonoscopy every 10 years starting at age 70. Hepatitis C blood test. Hepatitis B blood test. Sexually transmitted disease (STD) testing. Diabetes screening. This is done by checking your blood sugar (glucose) after you have not eaten for a while (fasting). You may have this done every 1-3 years. Bone density scan. This is done to screen for osteoporosis. You may have this done starting at age 67. Mammogram. This may be done every 1-2 years. Talk to your health care provider about how often you should have regular mammograms. Talk with your  health care provider about your test results, treatment options, and if necessary, the need for more tests. Vaccines  Your health care provider may recommend certain vaccines, such as: Influenza vaccine. This is recommended every year. Tetanus, diphtheria, and acellular pertussis (Tdap, Td) vaccine. You may need a Td booster every 10 years. Zoster vaccine. You may need this after age 82. Pneumococcal 13-valent conjugate (PCV13) vaccine. One dose is recommended after age 64. Pneumococcal polysaccharide (PPSV23) vaccine. One dose is recommended after age 58. Talk to your health care provider about which screenings and vaccines you need and how often you need them. This information is not intended to replace advice given to you by your health care provider. Make sure you discuss any questions you have with your health care provider. Document Released: 03/18/2015 Document Revised: 11/09/2015 Document Reviewed: 12/21/2014 Elsevier Interactive Patient Education  2017 [employer] Prevention in the Home Falls can cause injuries. They can happen to people of all ages. There are many things you can do to make your home safe and to help prevent falls. What can I do on the outside of my home? Regularly fix the edges of walkways and driveways and fix any cracks. Remove anything that might make you trip as you walk through a door, such as a raised step or threshold. Trim any bushes or trees on the path to your home. Use bright outdoor lighting. Clear any walking paths of anything that might make someone trip, such as rocks or tools. Regularly check to see if handrails are loose or broken. Make sure that both sides of any steps have handrails. Any raised decks and porches should have guardrails on the edges. Have any leaves, snow, or ice cleared regularly. Use sand or salt on walking paths during winter. Clean up any spills in your garage right away. This includes oil or grease spills. What can I  do in the bathroom? Use night lights. Install grab bars by the toilet and in the tub and shower. Do not use towel bars as grab bars. Use non-skid mats or decals in the tub or shower. If you need to sit down in the shower, use a plastic, non-slip stool. Keep the floor dry. Clean up any water that spills on the floor as soon as it happens. Remove soap buildup in the tub or shower regularly. Attach bath mats securely with double-sided non-slip rug tape. Do not have throw rugs and other things on the floor that can make you trip. What can I do in the bedroom? Use night lights. Make sure that you have a light by your bed that is easy to reach. Do not use any sheets or blankets that are too big for your bed. They should not hang down onto the floor. Have a firm chair that has side arms. You can use this for support while you get dressed. Do not have throw rugs and other things on the floor that can make you trip. What can I do in the kitchen? Clean up any spills right away. Avoid walking on wet floors. Keep items that you use a lot in easy-to-reach places. If you need to reach something above you, use a strong step stool that has a grab bar. Keep electrical cords out of the way. Do not use floor polish or wax that makes floors slippery. If you must use wax, use non-skid floor wax. Do not have throw rugs and other things on the floor that can make you trip. What can  I do with my stairs? Do not leave any items on the stairs. Make sure that there are handrails on both sides of the stairs and use them. Fix handrails that are broken or loose. Make sure that handrails are as long as the stairways. Check any carpeting to make sure that it is firmly attached to the stairs. Fix any carpet that is loose or worn. Avoid having throw rugs at the top or bottom of the stairs. If you do have throw rugs, attach them to the floor with carpet tape. Make sure that you have a light switch at the top of the stairs  and the bottom of the stairs. If you do not have them, ask someone to add them for you. What else can I do to help prevent falls? Wear shoes that: Do not have high heels. Have rubber bottoms. Are comfortable and fit you well. Are closed at the toe. Do not wear sandals. If you use a stepladder: Make sure that it is fully opened. Do not climb a closed stepladder. Make sure that both sides of the stepladder are locked into place. Ask someone to hold it for you, if possible. Clearly mark and make sure that you can see: Any grab bars or handrails. First and last steps. Where the edge of each step is. Use tools that help you move around (mobility aids) if they are needed. These include: Canes. Walkers. Scooters. Crutches. Turn on the lights when you go into a dark area. Replace any light bulbs as soon as they burn out. Set up your furniture so you have a clear path. Avoid moving your furniture around. If any of your floors are uneven, fix them. If there are any pets around you, be aware of where they are. Review your medicines with your doctor. Some medicines can make you feel dizzy. This can increase your chance of falling. Ask your doctor what other things that you can do to help prevent falls. This information is not intended to replace advice given to you by your health care provider. Make sure you discuss any questions you have with your health care provider. Document Released: 12/16/2008 Document Revised: 07/28/2015 Document Reviewed: 03/26/2014 Elsevier Interactive Patient Education  2017 [employer].

## 2021-03-17 NOTE — Progress Notes (Signed)
Subjective:   Elizabeth Vincent is a 71 y.o. female who presents for Medicare Annual (Subsequent) preventive examination.  Review of Systems     Cardiac Risk Factors include: advanced age (>19men, >98 women);dyslipidemia;hypertension     Objective:    Today's Vitals   03/17/21 1010  BP: 126/80  Pulse: 62  Temp: 97.6 F (36.4 C)  SpO2: 98%  Weight: 135 lb 12.8 oz (61.6 kg)  Height: 5\' 7"  (1.702 m)  PainSc: 0-No pain   Body mass index is 21.27 kg/m.  Advanced Directives 03/17/2021 03/11/2020  Does Patient Have a Medical Advance Directive? No No  Does patient want to make changes to medical advance directive? - Yes (MAU/Ambulatory/Procedural Areas - Information given)  Would patient like information on creating a medical advance directive? No - Patient declined -    Current Medications (verified) Outpatient Encounter Medications as of 03/17/2021  Medication Sig   Calcium-Magnesium-Vitamin D (CALCIUM 1200+D3 PO) Take by mouth.   lisinopril (ZESTRIL) 40 MG tablet TAKE ONE TABLET BY MOUTH DAILY   Multiple Vitamins-Minerals (PRESERVISION AREDS 2+MULTI VIT PO) Take by mouth.   olopatadine (PATANOL) 0.1 % ophthalmic solution Place 1 drop into both eyes in the morning and at bedtime.   No facility-administered encounter medications on file as of 03/17/2021.    Allergies (verified) Patient has no known allergies.   History: Past Medical History:  Diagnosis Date   Cancer ([place])    T cell Lymphoma   Cataract    bilateral lens implants   Colon polyps    Hyperlipidemia    Hypertension    Osteoporosis    Past Surgical History:  Procedure Laterality Date   BREAST BIOPSY     CATARACT EXTRACTION, BILATERAL     COLONOSCOPY  08/29/2020   EYE SURGERY     SKIN BIOPSY     Family History  Problem Relation Age of Onset   Cancer Mother    Early death Mother    Heart disease Father    Alcohol abuse Sister    Colon cancer Maternal Uncle    Cancer Maternal Grandmother    Cancer  Maternal Grandfather    Heart disease Paternal Grandmother    Cancer Paternal Grandfather    Stroke Paternal Grandfather    Rectal cancer Neg Hx    Stomach cancer Neg Hx    Esophageal cancer Neg Hx    Colon polyps Neg Hx    Social History   Socioeconomic History   Marital status: Single    Spouse name: Not on file   Number of children: Not on file   Years of education: Not on file   Highest education level: Not on file  Occupational History   Occupation: retired  Tobacco Use   Smoking status: Never   Smokeless tobacco: Never  Vaping Use   Vaping Use: Never used  Substance and Sexual Activity   Alcohol use: Yes    Comment: wine with dinner   Drug use: Never   Sexual activity: Not Currently  Other Topics Concern   Not on file  Social History Narrative   Not on file   Social Determinants of Health   Financial Resource Strain: Low Risk    Difficulty of Paying Living Expenses: Not hard at all  Food Insecurity: No Food Insecurity   Worried About Charity fundraiser in the Last Year: Never true   [place] in the Last Year: Never true  Transportation Needs: No Transportation Needs  Lack of Transportation (Medical): No   Lack of Transportation (Non-Medical): No  Physical Activity: Sufficiently Active   Days of Exercise per Week: 5 days   Minutes of Exercise per Session: 30 min  Stress: No Stress Concern Present   Feeling of Stress : Not at all  Social Connections: Moderately Integrated   Frequency of Communication with Friends and Family: More than three times a week   Frequency of Social Gatherings with Friends and Family: Twice a week   Attends Religious Services: More than 4 times per year   Active Member of [employer] or Organizations: Yes   Attends Archivist Meetings: 1 to 4 times per year   Marital Status: Never married    Tobacco Counseling Counseling given: Not Answered   Clinical Intake:  Pre-visit preparation completed: Yes  Pain :  No/denies pain Pain Score: 0-No pain     BMI - recorded: 21.27 Nutritional Status: BMI of 19-24  Normal Nutritional Risks: None Diabetes: No  How often do you need to have someone help you when you read instructions, pamphlets, or other written materials from your doctor or pharmacy?: 1 - Never What is the last grade level you completed in school?: College Graduate  Diabetic? no  Interpreter Needed?: No  Information entered by :: Elizabeth Abu, LPN   Activities of Daily Living In your present state of health, do you have any difficulty performing the following activities: 03/17/2021  Hearing? N  Vision? N  Difficulty concentrating or making decisions? N  Walking or climbing stairs? N  Dressing or bathing? N  Doing errands, shopping? N  Preparing Food and eating ? N  Using the Toilet? N  In the past six months, have you accidently leaked urine? N  Do you have problems with loss of bowel control? N  Managing your Medications? N  Managing your Finances? N  Housekeeping or managing your Housekeeping? N  Some recent data might be hidden    Patient Care Team: Hoyt Koch, MD as PCP - General (Internal Medicine)  Indicate any recent Medical Services you may have received from other than [facility] providers in the past year (date may be approximate).     Assessment:   This is a routine wellness examination for Elizabeth Vincent.  Hearing/Vision screen Hearing Screening - Comments:: Patient denied any hearing difficulty.   No hearing aids.  Vision Screening - Comments:: Patient wears corrective glasses/contacts.  Eye exam done annually by: Rutherford Guys, MD.  Dietary issues and exercise activities discussed: Current Exercise Habits: Home exercise routine, Type of exercise: walking (If there is good weather), Time (Minutes): 30, Frequency (Times/Week): 5, Weekly Exercise (Minutes/Week): 150, Intensity: Moderate, Exercise limited by: None identified   Goals Addressed                This Visit's Progress     Patient Stated (pt-stated)        My goal is to lose weight.  I would like to be under 130 pounds.      Depression Screen PHQ 2/9 Scores 03/17/2021 12/28/2020 05/19/2020 03/11/2020 11/18/2019 03/09/2019 10/15/2018  PHQ - 2 Score 0 0 0 0 0 0 0  PHQ- 9 Score - - - - - 1 -    Fall Risk Fall Risk  03/17/2021 12/28/2020 05/19/2020 03/11/2020 11/18/2019  Falls in the past year? 0 0 0 0 1  Number falls in past yr: 0 0 - 0 0  Injury with Fall? 0 0 - 0 -  Risk  for fall due to : No Fall Risks No Fall Risks - Impaired vision -  Follow up Falls evaluation completed - - Falls prevention discussed -    FALL RISK PREVENTION PERTAINING TO THE HOME:  Any stairs in or around the home? Yes  If so, are there any without handrails? No  Home free of loose throw rugs in walkways, pet beds, electrical cords, etc? Yes  Adequate lighting in your home to reduce risk of falls? Yes   ASSISTIVE DEVICES UTILIZED TO PREVENT FALLS:  Life alert? No  Use of a cane, walker or w/c? No  Grab bars in the bathroom? No  Shower chair or bench in shower? No  Elevated toilet seat or a handicapped toilet? No   TIMED UP AND GO:  Was the test performed? Yes .  Length of time to ambulate 10 feet: 6 sec.   Gait steady and fast without use of assistive device  Cognitive Function: Normal cognitive status assessed by direct observation by this Nurse Health Advisor. No abnormalities found.       6CIT Screen 03/11/2020  What Year? 0 points  What month? 0 points  Count back from 20 0 points  Months in reverse 0 points  Repeat phrase 0 points    Immunizations Immunization History  Administered Date(s) Administered   Fluad Quad(high Dose 65+) 01/20/2021   Influenza, High Dose Seasonal PF 11/14/2018   Influenza-Unspecified 11/14/2017, 12/11/2019   PFIZER(Purple Top)SARS-COV-2 Vaccination 04/18/2019, 05/11/2019, 12/02/2019, 06/11/2020   Pfizer Covid-19 Vaccine Bivalent Booster 78yrs & up  11/22/2020   Pneumococcal Polysaccharide-23 10/15/2018   Tdap 01/29/2018   Zoster Recombinat (Shingrix) 12/31/2019, 02/29/2020    TDAP status: Up to date  Flu Vaccine status: Up to date  Pneumococcal vaccine status: Completed during today's visit.  Covid-19 vaccine status: Completed vaccines  Qualifies for Shingles Vaccine? Yes   Zostavax completed No   Shingrix Completed?: Yes  Screening Tests Health Maintenance  Topic Date Due   Pneumonia Vaccine 91+ Years old (2 - PCV) 10/15/2019   COLONOSCOPY (Pts 45-83yrs Insurance coverage will need to be confirmed)  08/29/2021   MAMMOGRAM  07/29/2022   TETANUS/TDAP  01/30/2028   INFLUENZA VACCINE  Completed   DEXA SCAN  Completed   COVID-19 Vaccine  Completed   Hepatitis C Screening  Completed   Zoster Vaccines- Shingrix  Completed   HPV VACCINES  Aged Out    Health Maintenance  Health Maintenance Due  Topic Date Due   Pneumonia Vaccine 75+ Years old (2 - PCV) 10/15/2019    Colorectal cancer screening: Type of screening: Colonoscopy. Completed [id number]. Repeat every 1 years  Mammogram status: Completed 07/28/2020. Repeat every year  Bone Density status: Completed 06/26/2019. Results reflect: Bone density results: OSTEOPOROSIS. Repeat every 2 years.  Lung Cancer Screening: (Low Dose CT Chest recommended if Age 7-80 years, 30 pack-year currently smoking OR have quit w/in 15years.) does not qualify.   Lung Cancer Screening Referral: no  Additional Screening:  Hepatitis C Screening: does qualify; Completed yes  Vision Screening: Recommended annual ophthalmology exams for early detection of glaucoma and other disorders of the eye. Is the patient up to date with their annual eye exam?  Yes  Who is the provider or what is the name of the office in which the patient attends annual eye exams? Rutherford Guys, MD If pt is not established with a provider, would they like to be referred to a provider to establish care? No .   Dental  Screening:  Recommended annual dental exams for proper oral hygiene  Community Resource Referral / Chronic Care Management: CRR required this visit?  No   CCM required this visit?  No      Plan:     I have personally reviewed and noted the following in the patients chart:   Medical and social history Use of alcohol, tobacco or illicit drugs  Current medications and supplements including opioid prescriptions.  Functional ability and status Nutritional status Physical activity Advanced directives List of other physicians Hospitalizations, surgeries, and ER visits in previous 12 months Vitals Screenings to include cognitive, depression, and falls Referrals and appointments  In addition, I have reviewed and discussed with patient certain preventive protocols, quality metrics, and best practice recommendations. A written personalized care plan for preventive services as well as general preventive health recommendations were provided to patient.     Sheral Flow, LPN   [id number]   Nurse Notes:  Hearing Screening - Comments:: Patient denied any hearing difficulty.   No hearing aids.  Vision Screening - Comments:: Patient wears corrective glasses/contacts.  Eye exam done annually by: Rutherford Guys, MD.

## 2021-04-10 ENCOUNTER — Other Ambulatory Visit: Payer: Self-pay | Admitting: Family Medicine

## 2021-04-10 ENCOUNTER — Telehealth: Payer: Self-pay

## 2021-04-10 MED ORDER — LISINOPRIL 40 MG PO TABS: 40.0000 mg | ORAL_TABLET | Freq: Every day | ORAL | 2 refills | Status: DC

## 2021-04-10 NOTE — Telephone Encounter (Signed)
Pt calling requesting a refill on: lisinopril (ZESTRIL) 40 MG tablet  Pharmacy: [facility] [id number] - [place], [place] - [address]  [place] 12/22

## 2021-04-10 NOTE — Telephone Encounter (Signed)
Refill has been sent to the pt's pharmacy  

## 2021-04-16 IMAGING — MG DIGITAL SCREENING BILAT W/ TOMO W/ CAD
2 series · 2 of 10 positions shown · non-contrast
Comparison: None.

CLINICAL DATA: Screening.

EXAM:
DIGITAL SCREENING BILATERAL MAMMOGRAM WITH TOMO AND CAD

[L CC tomo · tomo slice 25/50.0]
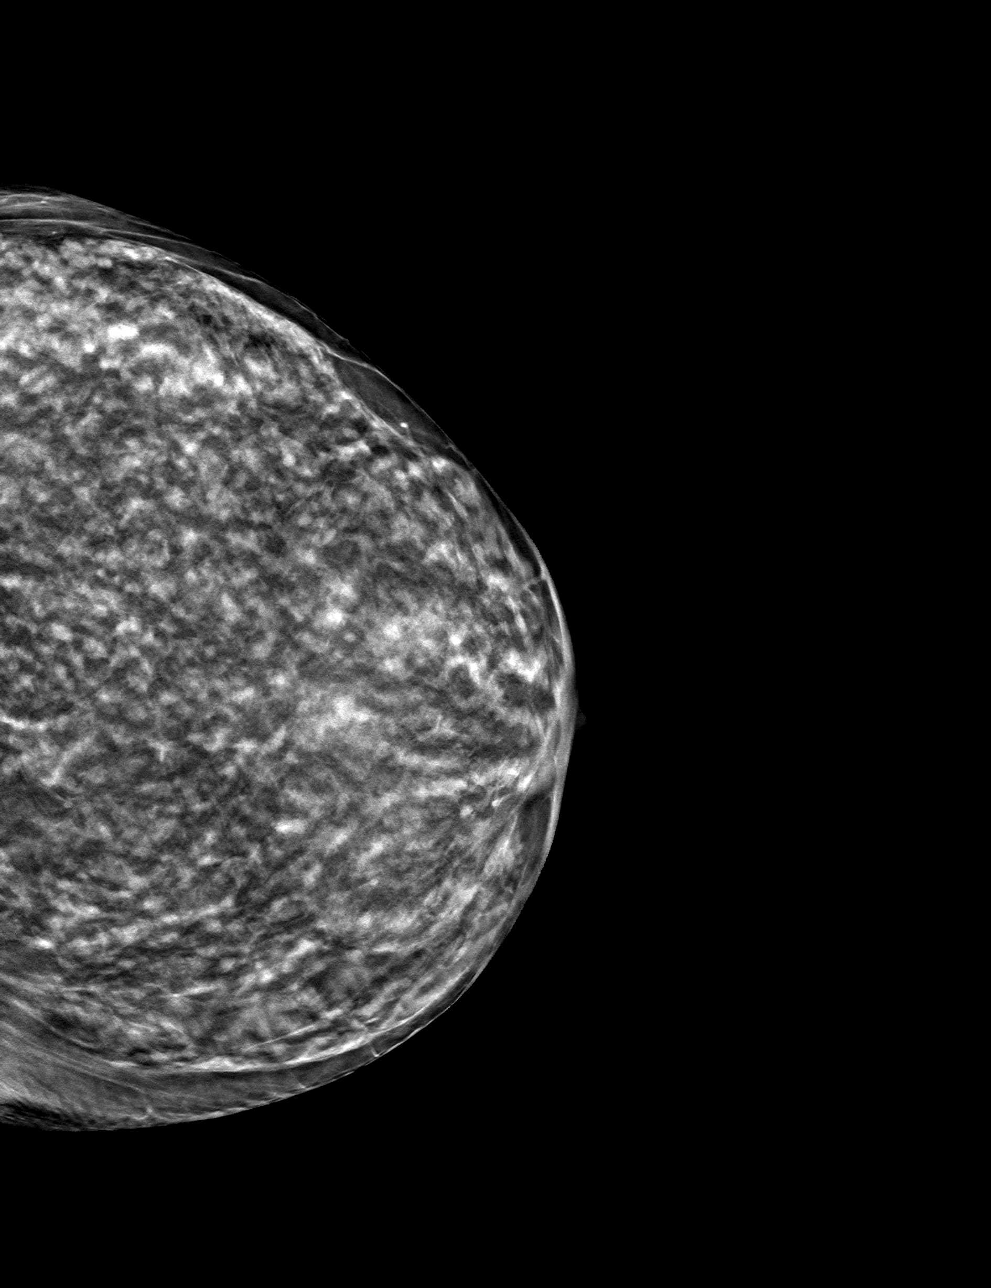

[R MLO tomo · tomo slice 25/49.0]
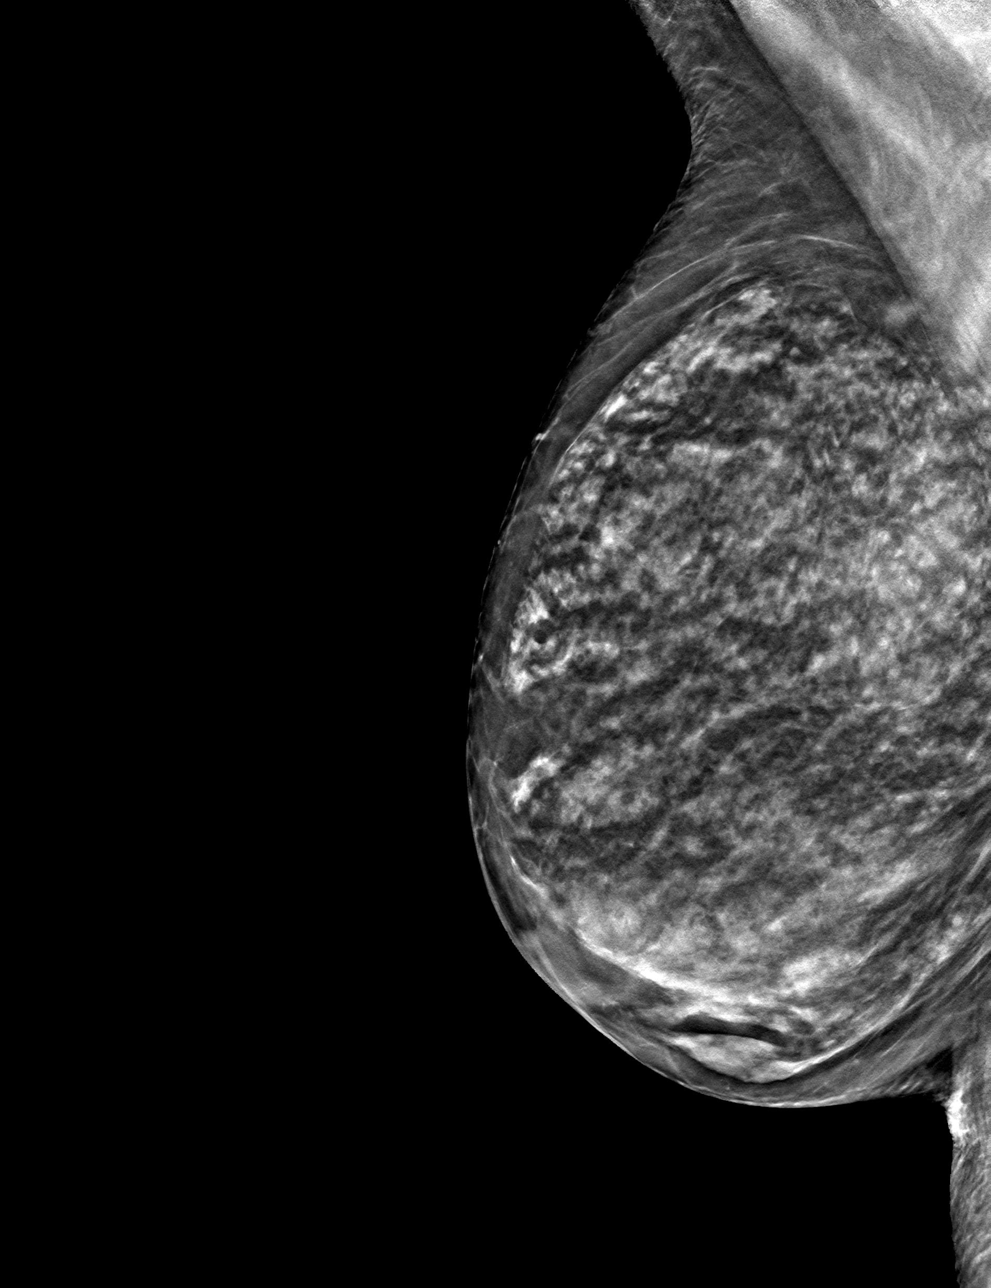

[2 of 10 positions shown; findings below may reference images not displayed]

ACR Breast Density Category d: The breast tissue is extremely dense,
which lowers the sensitivity of mammography.
FINDINGS: In the right breast, a possible mass warrants further evaluation. In
the left breast, no findings suspicious for malignancy. Images were
processed with CAD.
IMPRESSION: Further evaluation is suggested for possible mass in the right
breast.

RECOMMENDATION:
Diagnostic mammogram and possibly ultrasound of the right breast.
(Code:[id number])

The patient will be contacted regarding the findings, and additional
imaging will be scheduled.

BI-RADS CATEGORY  0: Incomplete. Need additional imaging evaluation
and/or prior mammograms for comparison.

## 2021-06-27 ENCOUNTER — Inpatient Hospital Stay: Admission: RE | Admit: 2021-06-27 | Payer: PPO | Source: Ambulatory Visit

## 2021-06-30 ENCOUNTER — Ambulatory Visit (INDEPENDENT_AMBULATORY_CARE_PROVIDER_SITE_OTHER)
Admission: RE | Admit: 2021-06-30 | Discharge: 2021-06-30 | Disposition: A | Discharge status: Home / Self Care | Payer: PPO | Source: Ambulatory Visit | Attending: Internal Medicine | Admitting: Internal Medicine

## 2021-06-30 DIAGNOSIS — M810 Age-related osteoporosis without current pathological fracture: Secondary | ICD-10-CM | POA: Diagnosis not present

## 2021-07-25 ENCOUNTER — Other Ambulatory Visit: Payer: Self-pay | Admitting: Internal Medicine

## 2021-07-25 DIAGNOSIS — Z1231 Encounter for screening mammogram for malignant neoplasm of breast: Secondary | ICD-10-CM

## 2021-08-02 ENCOUNTER — Ambulatory Visit
Admission: RE | Admit: 2021-08-02 | Discharge: 2021-08-02 | Disposition: A | Discharge status: Home / Self Care | Payer: PPO | Source: Ambulatory Visit

## 2021-08-02 DIAGNOSIS — Z1231 Encounter for screening mammogram for malignant neoplasm of breast: Secondary | ICD-10-CM | POA: Diagnosis not present

## 2021-08-09 ENCOUNTER — Ambulatory Visit: Payer: PPO | Admitting: Internal Medicine

## 2021-08-11 ENCOUNTER — Encounter: Payer: Self-pay | Admitting: Internal Medicine

## 2021-08-11 ENCOUNTER — Ambulatory Visit (INDEPENDENT_AMBULATORY_CARE_PROVIDER_SITE_OTHER): Payer: PPO | Admitting: Internal Medicine

## 2021-08-11 DIAGNOSIS — M8580 Other specified disorders of bone density and structure, unspecified site: Secondary | ICD-10-CM

## 2021-08-11 DIAGNOSIS — I10 Essential (primary) hypertension: Secondary | ICD-10-CM

## 2021-08-11 DIAGNOSIS — E782 Mixed hyperlipidemia: Secondary | ICD-10-CM | POA: Diagnosis not present

## 2021-08-11 NOTE — Patient Instructions (Signed)
We do not need labs today. We will recheck the bone density in 3-5 years.

## 2021-08-11 NOTE — Progress Notes (Signed)
   Subjective:   Patient ID: Elizabeth Vincent, female    DOB: 18-Dec-1950, 71 y.o.   MRN: [id number]  HPI The patient is a 71 YO female coming in for follow up.  Review of Systems  Constitutional: Negative.   HENT: Negative.    Eyes: Negative.   Respiratory:  Negative for cough, chest tightness and shortness of breath.   Cardiovascular:  Negative for chest pain, palpitations and leg swelling.  Gastrointestinal:  Negative for abdominal distention, abdominal pain, constipation, diarrhea, nausea and vomiting.  Musculoskeletal: Negative.   Skin: Negative.   Neurological: Negative.   Psychiatric/Behavioral: Negative.      Objective:  Physical Exam Constitutional:      Appearance: She is well-developed.  HENT:     Head: Normocephalic and atraumatic.  Cardiovascular:     Rate and Rhythm: Normal rate and regular rhythm.  Pulmonary:     Effort: Pulmonary effort is normal. No respiratory distress.     Breath sounds: Normal breath sounds. No wheezing or rales.  Abdominal:     General: Bowel sounds are normal. There is no distension.     Palpations: Abdomen is soft.     Tenderness: There is no abdominal tenderness. There is no rebound.  Musculoskeletal:     Cervical back: Normal range of motion.  Skin:    General: Skin is warm and dry.  Neurological:     Mental Status: She is alert and oriented to person, place, and time.     Coordination: Coordination normal.     Vitals:   08/11/21 0828  BP: 122/64  Pulse: 68  Resp: 18  SpO2: 94%  Weight: 133 lb (60.3 kg)  Height: '5\' 7"'$  (1.702 m)    Assessment & Plan:

## 2021-08-11 NOTE — Assessment & Plan Note (Signed)
Discussed recent DEXA scan with lowest value -2.1 which does not require treatment. FRAX below treatment threshold as well. Recommend calcium and vitamin D still and weight bearing exercise. Repeat DEXA in 3 years.

## 2021-08-11 NOTE — Assessment & Plan Note (Signed)
BP at goal on lisinopril 40 mg daily. Recent labs reviewed with her during visit and no indication for change. Continue close monitoring.

## 2021-08-11 NOTE — Assessment & Plan Note (Signed)
We discussed her numbers and overall risk today and she wishes to continue with lifestyle changes.

## 2021-08-31 ENCOUNTER — Encounter: Payer: Self-pay | Admitting: Gastroenterology

## 2021-10-17 ENCOUNTER — Ambulatory Visit (AMBULATORY_SURGERY_CENTER): Payer: Self-pay

## 2021-10-17 VITALS — Ht 67.0 in | Wt 136.0 lb

## 2021-10-17 DIAGNOSIS — Z86010 Personal history of colonic polyps: Secondary | ICD-10-CM

## 2021-10-17 NOTE — Progress Notes (Signed)
No egg or soy allergy known to patient  No issues known to pt with past sedation with any surgeries or procedures Patient denies ever being told they had issues or difficulty with intubation  No FH of Malignant Hyperthermia Pt is not on diet pills Pt is not on home 02  Pt is not on blood thinners  Pt denies issues with constipation  No A fib or A flutter Have any cardiac testing pending--NO Pt instructed to use [email or web address] or GoodRx for a price reduction on prep   

## 2021-10-30 ENCOUNTER — Encounter: Payer: Self-pay | Admitting: Gastroenterology

## 2021-11-14 ENCOUNTER — Ambulatory Visit (AMBULATORY_SURGERY_CENTER): Payer: PPO | Admitting: Gastroenterology

## 2021-11-14 ENCOUNTER — Encounter: Payer: Self-pay | Admitting: Gastroenterology

## 2021-11-14 VITALS — BP 125/62 | HR 62 | Temp 97.5°F | Resp 12 | Ht 67.0 in | Wt 136.0 lb

## 2021-11-14 DIAGNOSIS — Z09 Encounter for follow-up examination after completed treatment for conditions other than malignant neoplasm: Secondary | ICD-10-CM

## 2021-11-14 DIAGNOSIS — K635 Polyp of colon: Secondary | ICD-10-CM | POA: Diagnosis not present

## 2021-11-14 DIAGNOSIS — D122 Benign neoplasm of ascending colon: Secondary | ICD-10-CM

## 2021-11-14 DIAGNOSIS — I10 Essential (primary) hypertension: Secondary | ICD-10-CM | POA: Diagnosis not present

## 2021-11-14 DIAGNOSIS — Z86010 Personal history of colonic polyps: Secondary | ICD-10-CM | POA: Diagnosis not present

## 2021-11-14 MED ORDER — SODIUM CHLORIDE 0.9 % IV SOLN: 500.0000 mL | Freq: Once | INTRAVENOUS | Status: DC

## 2021-11-14 NOTE — Progress Notes (Signed)
Called to room to assist during endoscopic procedure.  Patient ID and intended procedure confirmed with present staff. Received instructions for my participation in the procedure from the performing physician.  

## 2021-11-14 NOTE — Progress Notes (Signed)
Referring Provider: Hoyt Koch, * Primary Care Physician:  Hoyt Koch, MD  Indication for Procedure:  Colon cancer Surveillance   IMPRESSION:  Need for colon cancer surveillance Appropriate candidate for monitored anesthesia care  PLAN: Colonoscopy in the [place] today   HPI: Elizabeth Vincent is a 71 y.o. female presents for surveillance colonoscopy.  Prior endoscopic history: - Large, flat sessile serrated adenoma removed in a piecemeal fashion on colonoscopy 07/28/19. - Colonoscopy 08/29/20:    - Non-bleeding external and internal hemorrhoids were found.    - Multiple small and large-mouthed diverticula were found in the sigmoid colon and descending colon.    - Two sessile polyps were found in the sigmoid colon and transverse colon. The polyps were 2 mm in size. These polyps were removed with a cold snare. Resection and retrieval were complete. Estimated blood loss was minimal.    - A 15 mm polyp was found in the proximal ascending colon. The polyp was located one fold from the IC valve and extended to the backside of the fold. The polyp was carpet-like. The polyp was removed with a cold snare in a piecemeal fashion. There were some residual pieces left on the back side of the polyp that were removed with cold forceps. Resection and retrieval appeared to be complete but the polyp margins were altered due to the piecemeal technique. Estimated blood loss was minimal.    - A 2 mm polyp was found in the cecum. The polyp was flat. The polyp was removed with a cold snare. Resection and retrieval were complete. Estimated blood loss was minimal.    - The exam was otherwise without abnormality on direct and retroflexion views.  1. Surgical [P], colon, cecum, polyp (1) - COLONIC MUCOSA WITH BENIGN LYMPHOID AGGREGATE. - NO ADENOMATOUS CHANGE OR CARCINOMA. 2. Surgical [P], colon, ascending, polyp (1) - MULTIPLE FRAGMENTS OF TUBULAR ADENOMA. - NO HIGH GRADE DYSPLASIA  OR CARCINOMA. 3. Surgical [P], colon, transverse and ascending, polyp (2) - TUBULAR ADENOMA. - NO HIGH GRADE DYSPLASIA OR CARCINOMA. - HYPERPLASTIC POLYP.    Past Medical History:  Diagnosis Date   Cancer ([place])    T cell Lymphoma   Cataract    bilateral lens implants   Colon polyps    Hyperlipidemia    Hypertension    on meds   Osteoporosis    Seasonal allergies     Past Surgical History:  Procedure Laterality Date   BREAST BIOPSY     CATARACT EXTRACTION, BILATERAL     COLONOSCOPY  08/29/2020   [facility](good)-hems/tics/piecemeal polyp (2 cecal)-1 yr recall   EYE SURGERY     as a child   SKIN BIOPSY      Current Outpatient Medications  Medication Sig Dispense Refill   Calcium-Magnesium-Vitamin D (CALCIUM 1200+D3 PO) Take 1 tablet by mouth in the morning and at bedtime.     lisinopril (ZESTRIL) 40 MG tablet Take 1 tablet (40 mg total) by mouth daily. 90 tablet 2   Multiple Vitamins-Minerals (PRESERVISION AREDS 2+MULTI VIT PO) Take 1 tablet by mouth in the morning and at bedtime.     olopatadine (PATADAY) 0.1 % ophthalmic solution Place 1 drop into both eyes daily as needed.     Current Facility-Administered Medications  Medication Dose Route Frequency Provider Last Rate Last Admin   0.9 %  sodium chloride infusion  500 mL Intravenous Once Thornton Park, MD        Allergies as of 11/14/2021   (No Known  Allergies)    Family History  Problem Relation Age of Onset   Cancer Mother    Early death Mother    Heart disease Father    Alcohol abuse Sister    Colon polyps Maternal Uncle 61   Colon cancer Maternal Uncle 78   Lung cancer Maternal Uncle    Cancer Maternal Grandmother    Cancer Maternal Grandfather    Heart disease Paternal Grandmother    Cancer Paternal Grandfather    Stroke Paternal Grandfather    Rectal cancer Neg Hx    Stomach cancer Neg Hx    Esophageal cancer Neg Hx      Physical Exam: General:   Alert,  well-nourished, pleasant and  cooperative in NAD Head:  Normocephalic and atraumatic. Eyes:  Sclera clear, no icterus.   Conjunctiva pink. Mouth:  No deformity or lesions.   Neck:  Supple; no masses or thyromegaly. Lungs:  Clear throughout to auscultation.   No wheezes. Heart:  Regular rate and rhythm; no murmurs. Abdomen:  Soft, non-tender, nondistended, normal bowel sounds, no rebound or guarding.  Msk:  Symmetrical. No boney deformities LAD: No inguinal or umbilical LAD Extremities:  No clubbing or edema. Neurologic:  Alert and  oriented x4;  grossly nonfocal Skin:  No obvious rash or bruise. Psych:  Alert and cooperative. Normal mood and affect.     Studies/Results: No results found.    Taiten Brawn L. Tarri Glenn, MD, MPH 11/14/2021, 10:08 AM

## 2021-11-14 NOTE — Progress Notes (Signed)
Report to PACU, RN, vss, BBS= Clear.  

## 2021-11-14 NOTE — Patient Instructions (Signed)
Handouts provided about hemorrhoids, high fiber diet, diverticulosis and polyps.  Use a daily stool bulking agent such as Metamucil or Benefiber.  Await pathology results.  Repeat colonoscopy in 3 years for surveillance if clinically appropriate at that time.  Use a different bowel prep at that time.    YOU HAD AN ENDOSCOPIC PROCEDURE TODAY AT [facility]:   Refer to the procedure report that was given to you for any specific questions about what was found during the examination.  If the procedure report does not answer your questions, please call your gastroenterologist to clarify.  If you requested that your care partner not be given the details of your procedure findings, then the procedure report has been included in a sealed envelope for you to review at your convenience later.  YOU SHOULD EXPECT: Some feelings of bloating in the abdomen. Passage of more gas than usual.  Walking can help get rid of the air that was put into your GI tract during the procedure and reduce the bloating. If you had a lower endoscopy (such as a colonoscopy or flexible sigmoidoscopy) you may notice spotting of blood in your stool or on the toilet paper. If you underwent a bowel prep for your procedure, you may not have a normal bowel movement for a few days.  Please Note:  You might notice some irritation and congestion in your nose or some drainage.  This is from the oxygen used during your procedure.  There is no need for concern and it should clear up in a day or so.  SYMPTOMS TO REPORT IMMEDIATELY:  Following lower endoscopy (colonoscopy or flexible sigmoidoscopy):  Excessive amounts of blood in the stool  Significant tenderness or worsening of abdominal pains  Swelling of the abdomen that is new, acute  Fever of 100F or higher  For urgent or emergent issues, a gastroenterologist can be reached at any hour by calling [phone]. Do not use MyChart messaging for urgent concerns.    DIET:   We do recommend a small meal at first, but then you may proceed to your regular diet.  Drink plenty of fluids but you should avoid alcoholic beverages for 24 hours.  ACTIVITY:  You should plan to take it easy for the rest of today and you should NOT DRIVE or use heavy machinery until tomorrow (because of the sedation medicines used during the test).    FOLLOW UP: Our staff will call the number listed on your records the next business day following your procedure.  We will call around 7:15- 8:00 am to check on you and address any questions or concerns that you may have regarding the information given to you following your procedure. If we do not reach you, we will leave a message.     If any biopsies were taken you will be contacted by phone or by letter within the next 1-3 weeks.  Please call us at [phone] if you have not heard about the biopsies in 3 weeks.    SIGNATURES/CONFIDENTIALITY: You and/or your care partner have signed paperwork which will be entered into your electronic medical record.  These signatures attest to the fact that that the information above on your After Visit Summary has been reviewed and is understood.  Full responsibility of the confidentiality of this discharge information lies with you and/or your care-partner.

## 2021-11-14 NOTE — Progress Notes (Signed)
Pt's states no medical or surgical changes since previsit or office visit. 

## 2021-11-14 NOTE — Op Note (Signed)
[facility] Patient Name: Elizabeth Vincent Procedure Date: 11/14/2021 10:06 AM MRN: [id number] Endoscopist: Thornton Park MD, MD Age: 71 Referring MD:  Date of Birth: 1950-10-27 Gender: Female Account #: [id number] Procedure:                Colonoscopy Indications:              High risk colon cancer surveillance: Personal                            history of adenoma (10 mm or greater in size)                           Large ascending colon polyp removed in piecemeal                            fashion 2022, surveillance recommended in 1 year Medicines:                Monitored Anesthesia Care Procedure:                Pre-Anesthesia Assessment:                           - Prior to the procedure, a History and Physical                            was performed, and patient medications and                            allergies were reviewed. The patient's tolerance of                            previous anesthesia was also reviewed. The risks                            and benefits of the procedure and the sedation                            options and risks were discussed with the patient.                            All questions were answered, and informed consent                            was obtained. Prior Anticoagulants: The patient has                            taken no previous anticoagulant or antiplatelet                            agents. ASA Grade Assessment: II - A patient with                            mild systemic disease. After reviewing the risks  and benefits, the patient was deemed in                            satisfactory condition to undergo the procedure.                           After obtaining informed consent, the colonoscope                            was passed under direct vision. Throughout the                            procedure, the patient's blood pressure, pulse, and                            oxygen  saturations were monitored continuously. The                            Colonoscope was introduced through the anus and                            advanced to the 3 cm into the ileum. A second                            forward view of the right colon was performed. The                            colonoscopy was performed with moderate difficulty                            due to the large amount of liquid that needed to be                            lavaged during the procedure. The patient tolerated                            the procedure well. The quality of the bowel                            preparation was adequate. 879m of liquid were                            aspirated during the procedure. The terminal ileum,                            ileocecal valve, appendiceal orifice, and rectum                            were photographed. Scope In: 10:20:29 AM Scope Out: 10:35:59 AM Scope Withdrawal Time: 0 hours 12 minutes 28 seconds  Total Procedure Duration: 0 hours 15 minutes 30 seconds  Findings:                 Non-bleeding external and internal  hemorrhoids were                            found.                           Multiple small and large-mouthed diverticula were                            found in the sigmoid colon and descending colon.                           A 2 mm polyp was found in the proximal ascending                            colon. The polyp was sessile. The polyp was removed                            with a cold snare. Resection and retrieval were                            complete. Estimated blood loss was minimal.                           A 8 mm polyp was found in the distal ascending                            colon. The polyp was sessile. The polyp was removed                            with a cold snare. Resection and retrieval were                            complete. Estimated blood loss was minimal.                           An area of scarred  mucosa was found in the proximal                            ascending colon in the location of a prior                            polypectomy.                           The exam was otherwise without abnormality on                            direct and retroflexion views. Complications:            No immediate complications. Estimated Blood Loss:     Estimated blood loss was minimal. Impression:               - Non-bleeding external and internal hemorrhoids.                           -  Diverticulosis in the sigmoid colon and in the                            descending colon.                           - One 2 mm polyp in the proximal ascending colon,                            removed with a cold snare. Resected and retrieved.                           - One 8 mm polyp in the distal ascending colon,                            removed with a cold snare. Resected and retrieved.                           - Scarred mucosa in the proximal ascending colon.                           - The examination was otherwise normal on direct                            and retroflexion views. Recommendation:           - Patient has a contact number available for                            emergencies. The signs and symptoms of potential                            delayed complications were discussed with the                            patient. Return to normal activities tomorrow.                            Written discharge instructions were provided to the                            patient.                           - High fiber diet. Use a daily stool bulking agent                            such as Metamucil or Benefiber.                           - Continue present medications.                           - Await pathology results.                           -  Repeat colonoscopy in 3 years for surveillance if                            clinically appropriate at that time. Use a                             different bowel prep at that time.                           - Emerging evidence supports eating a diet of                            fruits, vegetables, grains, calcium, and yogurt                            while reducing red meat and alcohol may reduce the                            risk of colon cancer.                           - Thank you for allowing me to be involved in your                            colon cancer prevention. Thornton Park MD, MD 11/14/2021 10:43:49 AM This report has been signed electronically.

## 2021-11-15 ENCOUNTER — Telehealth: Payer: Self-pay

## 2021-11-15 NOTE — Telephone Encounter (Signed)
  Follow up Call-     11/14/2021    9:45 AM 08/29/2020   10:46 AM 07/28/2019   10:01 AM  Call back number  Post procedure Call Back phone  # [phone]  Permission to leave phone message Yes Yes Yes     Patient questions:  Do you have a fever, pain , or abdominal swelling? No. Pain Score  0 *  Have you tolerated food without any problems? Yes.    Have you been able to return to your normal activities? Yes.    Do you have any questions about your discharge instructions: Diet   No. Medications  No. Follow up visit  No.  Do you have questions or concerns about your Care? No.  Actions: * If pain score is 4 or above: No action needed, pain <4.

## 2021-11-19 ENCOUNTER — Encounter: Payer: Self-pay | Admitting: Gastroenterology

## 2022-01-08 ENCOUNTER — Other Ambulatory Visit: Payer: Self-pay | Admitting: Internal Medicine

## 2022-01-18 DIAGNOSIS — H35372 Puckering of macula, left eye: Secondary | ICD-10-CM | POA: Diagnosis not present

## 2022-01-18 DIAGNOSIS — H524 Presbyopia: Secondary | ICD-10-CM | POA: Diagnosis not present

## 2022-01-18 DIAGNOSIS — Z961 Presence of intraocular lens: Secondary | ICD-10-CM | POA: Diagnosis not present

## 2022-01-18 DIAGNOSIS — H04123 Dry eye syndrome of bilateral lacrimal glands: Secondary | ICD-10-CM | POA: Diagnosis not present

## 2022-01-18 DIAGNOSIS — H52203 Unspecified astigmatism, bilateral: Secondary | ICD-10-CM | POA: Diagnosis not present

## 2022-01-23 DIAGNOSIS — H35373 Puckering of macula, bilateral: Secondary | ICD-10-CM | POA: Diagnosis not present

## 2022-02-09 ENCOUNTER — Ambulatory Visit (INDEPENDENT_AMBULATORY_CARE_PROVIDER_SITE_OTHER): Payer: PPO | Admitting: Internal Medicine

## 2022-02-09 ENCOUNTER — Encounter: Payer: Self-pay | Admitting: Internal Medicine

## 2022-02-09 VITALS — BP 134/78 | HR 66 | Temp 97.8°F | Ht 67.0 in | Wt 134.0 lb

## 2022-02-09 DIAGNOSIS — Z0000 Encounter for general adult medical examination without abnormal findings: Secondary | ICD-10-CM | POA: Diagnosis not present

## 2022-02-09 DIAGNOSIS — E782 Mixed hyperlipidemia: Secondary | ICD-10-CM | POA: Diagnosis not present

## 2022-02-09 DIAGNOSIS — I10 Essential (primary) hypertension: Secondary | ICD-10-CM

## 2022-02-09 DIAGNOSIS — C84A0 Cutaneous T-cell lymphoma, unspecified, unspecified site: Secondary | ICD-10-CM | POA: Diagnosis not present

## 2022-02-09 LAB — CBC
HCT: 41.5 % (ref 36.0–46.0)
Hemoglobin: 14 g/dL (ref 12.0–15.0)
MCHC: 33.7 g/dL (ref 30.0–36.0)
MCV: 94.3 fl (ref 78.0–100.0)
Platelets: 375 10*3/uL (ref 150.0–400.0)
RBC: 4.4 Mil/uL (ref 3.87–5.11)
RDW: 14.1 % (ref 11.5–15.5)
WBC: 4.8 10*3/uL (ref 4.0–10.5)

## 2022-02-09 LAB — COMPREHENSIVE METABOLIC PANEL
ALT: 11 U/L (ref 0–35)
AST: 17 U/L (ref 0–37)
Albumin: 4.8 g/dL (ref 3.5–5.2)
Alkaline Phosphatase: 28 U/L — ABNORMAL LOW (ref 39–117)
BUN: 17 mg/dL (ref 6–23)
CO2: 31 mEq/L (ref 19–32)
Calcium: 10 mg/dL (ref 8.4–10.5)
Chloride: 95 mEq/L — ABNORMAL LOW (ref 96–112)
Creatinine, Ser: 0.76 mg/dL (ref 0.40–1.20)
GFR: 77.97 mL/min (ref >60.00)
Glucose, Bld: 97 mg/dL (ref 70–99)
Potassium: 4.4 mEq/L (ref 3.5–5.1)
Sodium: 135 mEq/L (ref 135–145)
Total Bilirubin: 0.4 mg/dL (ref 0.2–1.2)
Total Protein: 7.8 g/dL (ref 6.0–8.3)

## 2022-02-09 LAB — LIPID PANEL
Cholesterol: 247 mg/dL — ABNORMAL HIGH (ref 0–200)
HDL: 75.2 mg/dL (ref >39.00)
LDL Cholesterol: 156 mg/dL — ABNORMAL HIGH (ref 0–99)
NonHDL: 171.73
Total CHOL/HDL Ratio: 3
Triglycerides: 80 mg/dL (ref 0.0–149.0)
VLDL: 16 mg/dL (ref 0.0–40.0)

## 2022-02-09 MED ORDER — LISINOPRIL 20 MG PO TABS: 20.0000 mg | ORAL_TABLET | Freq: Every day | ORAL | 3 refills | Status: DC

## 2022-02-09 NOTE — Patient Instructions (Signed)
We have sent in the lisinopril 20 mg to switch to when you run out of the 40 mg.

## 2022-02-09 NOTE — Assessment & Plan Note (Signed)
Checking lipid panel and adjust as needed. Currently not on statin.

## 2022-02-09 NOTE — Assessment & Plan Note (Signed)
Flu shot up to date. Covid-19 counseled. Pneumonia complete. Shingrix complete. Tetanus up to date. Colonoscopy up to date. Mammogram up to date, pap smear aged out and dexa due 2025. Counseled about sun safety and mole surveillance. Counseled about the dangers of distracted driving. Given 10 year screening recommendations.

## 2022-02-09 NOTE — Progress Notes (Signed)
   Subjective:   Patient ID: Elizabeth Vincent, female    DOB: [birth year], 71 y.o.   MRN: [id number]  HPI The patient is here for physical.  PMH, [facility], social history reviewed and updated  Review of Systems  Constitutional: Negative.   HENT: Negative.    Eyes: Negative.   Respiratory:  Negative for cough, chest tightness and shortness of breath.   Cardiovascular:  Negative for chest pain, palpitations and leg swelling.  Gastrointestinal:  Negative for abdominal distention, abdominal pain, constipation, diarrhea, nausea and vomiting.  Musculoskeletal: Negative.   Skin: Negative.   Neurological: Negative.   Psychiatric/Behavioral: Negative.      Objective:  Physical Exam Constitutional:      Appearance: She is well-developed.  HENT:     Head: Normocephalic and atraumatic.  Cardiovascular:     Rate and Rhythm: Normal rate and regular rhythm.  Pulmonary:     Effort: Pulmonary effort is normal. No respiratory distress.     Breath sounds: Normal breath sounds. No wheezing or rales.  Abdominal:     General: Bowel sounds are normal. There is no distension.     Palpations: Abdomen is soft.     Tenderness: There is no abdominal tenderness. There is no rebound.  Musculoskeletal:     Cervical back: Normal range of motion.  Skin:    General: Skin is warm and dry.  Neurological:     Mental Status: She is alert and oriented to person, place, and time.     Coordination: Coordination normal.     Vitals:   02/09/22 0910  BP: 134/78  Pulse: 66  Temp: 97.8 F (36.6 C)  TempSrc: Oral  SpO2: 98%  Weight: 134 lb (60.8 kg)  Height: '5\' 7"'$  (1.702 m)    Assessment & Plan:

## 2022-02-09 NOTE — Assessment & Plan Note (Signed)
Stable without recurrence.

## 2022-02-09 NOTE — Assessment & Plan Note (Signed)
BP at goal on lisinopril 40 mg daily. She wishes to try dose reduction so we agree on lisinopril 20 mg daily. Counseled about diet and exercise to help as well. She will monitor at new dose and let us know if problems. Checking CBC and CMP. New rx done.

## 2022-02-11 DIAGNOSIS — R42 Dizziness and giddiness: Secondary | ICD-10-CM | POA: Diagnosis not present

## 2022-02-11 DIAGNOSIS — R55 Syncope and collapse: Secondary | ICD-10-CM | POA: Diagnosis not present

## 2022-02-11 DIAGNOSIS — R1111 Vomiting without nausea: Secondary | ICD-10-CM | POA: Diagnosis not present

## 2022-02-12 ENCOUNTER — Encounter: Payer: Self-pay | Admitting: Internal Medicine

## 2022-02-13 ENCOUNTER — Telehealth: Payer: Self-pay | Admitting: Internal Medicine

## 2022-02-13 NOTE — Telephone Encounter (Signed)
Patient called back about message bellow:   Elizabeth Vincent, [place] 02/13/2022  1:45 PM EST Back to Top    LVM for pt to call the clinic for her lab results.   Macie Burows, CMA 02/13/2022  8:22 AM EST     Contacted patient and was unable to leave a voicemail. 1st attempt   Hoyt Koch, MD 02/12/2022  7:43 AM EST     Normal/stable labs except cholesterol is high. We should think about medicine to help this. No changes needed.   I advised her of Dr Sharlet Salina message and she said she would like to start on something but is concerned, She doesn't want to start on something with a lot of side effects. She said she has heard bad things about the Statins. Call back is [phone]

## 2022-02-15 MED ORDER — PRAVASTATIN SODIUM 20 MG PO TABS: 20.0000 mg | ORAL_TABLET | Freq: Every day | ORAL | 3 refills | Status: DC

## 2022-02-15 NOTE — Telephone Encounter (Signed)
Lvm for pt that Cholesterol medication has been sent in. She is more then welcome to call back for information regarding med per Dr. Nathanial Millman advice.

## 2022-02-15 NOTE — Telephone Encounter (Signed)
Have sent in pravastatin as this is a very safe medicine which has the ability to lower her risk of heart attack and stroke by 20-30%. Recheck lipids in 3-6 months.

## 2022-03-28 ENCOUNTER — Ambulatory Visit (INDEPENDENT_AMBULATORY_CARE_PROVIDER_SITE_OTHER): Payer: PPO

## 2022-03-28 ENCOUNTER — Telehealth: Payer: Self-pay

## 2022-03-28 VITALS — BP 122/64 | HR 67 | Temp 97.5°F | Ht 66.0 in | Wt 135.2 lb

## 2022-03-28 DIAGNOSIS — Z0000 Encounter for general adult medical examination without abnormal findings: Secondary | ICD-10-CM

## 2022-03-28 DIAGNOSIS — E782 Mixed hyperlipidemia: Secondary | ICD-10-CM

## 2022-03-28 NOTE — Patient Instructions (Signed)
Elizabeth Vincent , Thank you for taking time to come for your Medicare Wellness Visit. I appreciate your ongoing commitment to your health goals. Please review the following plan we discussed and let me know if I can assist you in the future.   These are the goals we discussed:  Goals      My goal for 2024 is to lose weight and control my cholesterol levels.        This is a list of the screening recommended for you and due dates:  Health Maintenance  Topic Date Due   Medicare Annual Wellness Visit  03/17/2022   COVID-19 Vaccine (7 - 2023-24 season) 07/04/2022*   Mammogram  08/03/2023   Colon Cancer Screening  11/14/2024   DTaP/Tdap/Td vaccine (2 - Td or Tdap) 01/30/2028   Pneumonia Vaccine  Completed   Flu Shot  Completed   DEXA scan (bone density measurement)  Completed   Hepatitis C Screening: USPSTF Recommendation to screen - Ages 57-79 yo.  Completed   Zoster (Shingles) Vaccine  Completed   HPV Vaccine  Aged Out  *Topic was postponed. The date shown is not the original due date.    Advanced directives: No  Conditions/risks identified: Yes  Next appointment: Follow up in one year for your annual wellness visit.   Preventive Care 62 Years and Older, Female Preventive care refers to lifestyle choices and visits with your health care provider that can promote health and wellness. What does preventive care include? A yearly physical exam. This is also called an annual well check. Dental exams once or twice a year. Routine eye exams. Ask your health care provider how often you should have your eyes checked. Personal lifestyle choices, including: Daily care of your teeth and gums. Regular physical activity. Eating a healthy diet. Avoiding tobacco and drug use. Limiting alcohol use. Practicing safe sex. Taking low-dose aspirin every day. Taking vitamin and mineral supplements as recommended by your health care provider. What happens during an annual well check? The services  and screenings done by your health care provider during your annual well check will depend on your age, overall health, lifestyle risk factors, and family history of disease. Counseling  Your health care provider may ask you questions about your: Alcohol use. Tobacco use. Drug use. Emotional well-being. Home and relationship well-being. Sexual activity. Eating habits. History of falls. Memory and ability to understand (cognition). Work and work Statistician. Reproductive health. Screening  You may have the following tests or measurements: Height, weight, and BMI. Blood pressure. Lipid and cholesterol levels. These may be checked every 5 years, or more frequently if you are over 56 years old. Skin check. Lung cancer screening. You may have this screening every year starting at age 29 if you have a 30-pack-year history of smoking and currently smoke or have quit within the past 15 years. Fecal occult blood test (FOBT) of the stool. You may have this test every year starting at age 52. Flexible sigmoidoscopy or colonoscopy. You may have a sigmoidoscopy every 5 years or a colonoscopy every 10 years starting at age 49. Hepatitis C blood test. Hepatitis B blood test. Sexually transmitted disease (STD) testing. Diabetes screening. This is done by checking your blood sugar (glucose) after you have not eaten for a while (fasting). You may have this done every 1-3 years. Bone density scan. This is done to screen for osteoporosis. You may have this done starting at age 34. Mammogram. This may be done every 1-2 years. Talk  to your health care provider about how often you should have regular mammograms. Talk with your health care provider about your test results, treatment options, and if necessary, the need for more tests. Vaccines  Your health care provider may recommend certain vaccines, such as: Influenza vaccine. This is recommended every year. Tetanus, diphtheria, and acellular pertussis  (Tdap, Td) vaccine. You may need a Td booster every 10 years. Zoster vaccine. You may need this after age 74. Pneumococcal 13-valent conjugate (PCV13) vaccine. One dose is recommended after age 26. Pneumococcal polysaccharide (PPSV23) vaccine. One dose is recommended after age 46. Talk to your health care provider about which screenings and vaccines you need and how often you need them. This information is not intended to replace advice given to you by your health care provider. Make sure you discuss any questions you have with your health care provider. Document Released: 03/18/2015 Document Revised: 11/09/2015 Document Reviewed: 12/21/2014 Elsevier Interactive Patient Education  2017 [place] Prevention in the Home Falls can cause injuries. They can happen to people of all ages. There are many things you can do to make your home safe and to help prevent falls. What can I do on the outside of my home? Regularly fix the edges of walkways and driveways and fix any cracks. Remove anything that might make you trip as you walk through a door, such as a raised step or threshold. Trim any bushes or trees on the path to your home. Use bright outdoor lighting. Clear any walking paths of anything that might make someone trip, such as rocks or tools. Regularly check to see if handrails are loose or broken. Make sure that both sides of any steps have handrails. Any raised decks and porches should have guardrails on the edges. Have any leaves, snow, or ice cleared regularly. Use sand or salt on walking paths during winter. Clean up any spills in your garage right away. This includes oil or grease spills. What can I do in the bathroom? Use night lights. Install grab bars by the toilet and in the tub and shower. Do not use towel bars as grab bars. Use non-skid mats or decals in the tub or shower. If you need to sit down in the shower, use a plastic, non-slip stool. Keep the floor dry. Clean  up any water that spills on the floor as soon as it happens. Remove soap buildup in the tub or shower regularly. Attach bath mats securely with double-sided non-slip rug tape. Do not have throw rugs and other things on the floor that can make you trip. What can I do in the bedroom? Use night lights. Make sure that you have a light by your bed that is easy to reach. Do not use any sheets or blankets that are too big for your bed. They should not hang down onto the floor. Have a firm chair that has side arms. You can use this for support while you get dressed. Do not have throw rugs and other things on the floor that can make you trip. What can I do in the kitchen? Clean up any spills right away. Avoid walking on wet floors. Keep items that you use a lot in easy-to-reach places. If you need to reach something above you, use a strong step stool that has a grab bar. Keep electrical cords out of the way. Do not use floor polish or wax that makes floors slippery. If you must use wax, use non-skid floor wax. Do  not have throw rugs and other things on the floor that can make you trip. What can I do with my stairs? Do not leave any items on the stairs. Make sure that there are handrails on both sides of the stairs and use them. Fix handrails that are broken or loose. Make sure that handrails are as long as the stairways. Check any carpeting to make sure that it is firmly attached to the stairs. Fix any carpet that is loose or worn. Avoid having throw rugs at the top or bottom of the stairs. If you do have throw rugs, attach them to the floor with carpet tape. Make sure that you have a light switch at the top of the stairs and the bottom of the stairs. If you do not have them, ask someone to add them for you. What else can I do to help prevent falls? Wear shoes that: Do not have high heels. Have rubber bottoms. Are comfortable and fit you well. Are closed at the toe. Do not wear sandals. If you  use a stepladder: Make sure that it is fully opened. Do not climb a closed stepladder. Make sure that both sides of the stepladder are locked into place. Ask someone to hold it for you, if possible. Clearly mark and make sure that you can see: Any grab bars or handrails. First and last steps. Where the edge of each step is. Use tools that help you move around (mobility aids) if they are needed. These include: Canes. Walkers. Scooters. Crutches. Turn on the lights when you go into a dark area. Replace any light bulbs as soon as they burn out. Set up your furniture so you have a clear path. Avoid moving your furniture around. If any of your floors are uneven, fix them. If there are any pets around you, be aware of where they are. Review your medicines with your doctor. Some medicines can make you feel dizzy. This can increase your chance of falling. Ask your doctor what other things that you can do to help prevent falls. This information is not intended to replace advice given to you by your health care provider. Make sure you discuss any questions you have with your health care provider. Document Released: 12/16/2008 Document Revised: 07/28/2015 Document Reviewed: 03/26/2014 Elsevier Interactive Patient Education  2017 [employer].

## 2022-03-28 NOTE — Progress Notes (Addendum)
Subjective:   Elizabeth Vincent is a 72 y.o. female who presents for Medicare Annual (Subsequent) preventive examination.  Review of Systems     Cardiac Risk Factors include: advanced age (>58mn, >>47women);dyslipidemia;hypertension;family history of premature cardiovascular disease     Objective:    Today's Vitals   03/28/22 1426  BP: 122/64  Pulse: 67  Temp: (!) 97.5 F (36.4 C)  SpO2: 92%  Weight: 135 lb 3.2 oz (61.3 kg)  Height: 5' 6"  (1.676 m)  PainSc: 0-No pain   Body mass index is 21.82 kg/m.     03/28/2022    2:42 PM 03/17/2021   10:13 AM 03/11/2020    2:28 PM  Advanced Directives  Does Patient Have a Medical Advance Directive? No No No  Does patient want to make changes to medical advance directive?   Yes (MAU/Ambulatory/Procedural Areas - Information given)  Would patient like information on creating a medical advance directive? No - Patient declined No - Patient declined     Current Medications (verified) Outpatient Encounter Medications as of 03/28/2022  Medication Sig   Calcium-Magnesium-Vitamin D (CALCIUM 1200+D3 PO) Take 1 tablet by mouth in the morning and at bedtime.   lisinopril (ZESTRIL) 20 MG tablet Take 1 tablet (20 mg total) by mouth daily.   Multiple Vitamins-Minerals (PRESERVISION AREDS 2+MULTI VIT PO) Take 1 tablet by mouth in the morning and at bedtime.   olopatadine (PATADAY) 0.1 % ophthalmic solution Place 1 drop into both eyes daily as needed.   pravastatin (PRAVACHOL) 20 MG tablet Take 1 tablet (20 mg total) by mouth daily.   No facility-administered encounter medications on file as of 03/28/2022.    Allergies (verified) Patient has no known allergies.   History: Past Medical History:  Diagnosis Date   Cancer ([place]    T cell Lymphoma   Cataract    bilateral lens implants   Colon polyps    Hyperlipidemia    Hypertension    on meds   Osteoporosis    Seasonal allergies    Past Surgical History:  Procedure Laterality Date    BREAST BIOPSY     CATARACT EXTRACTION, BILATERAL     COLONOSCOPY  08/29/2020   KB-MAC-Mira(good)-hems/tics/piecemeal polyp (2 cecal)-1 yr recall   EYE SURGERY     as a child   SKIN BIOPSY     Family History  Problem Relation Age of Onset   Cancer Mother    Early death Mother    Heart disease Father    Alcohol abuse Sister    Cancer Maternal Grandmother    Cancer Maternal Grandfather    Heart disease Paternal Grandmother    Cancer Paternal Grandfather    Stroke Paternal Grandfather    Colon polyps Maternal Uncle 78   Colon cancer Maternal Uncle 78   Lung cancer Maternal Uncle    Breast cancer Sister    Rectal cancer Neg Hx    Stomach cancer Neg Hx    Esophageal cancer Neg Hx    Social History   Socioeconomic History   Marital status: Single    Spouse name: Not on file   Number of children: Not on file   Years of education: Not on file   Highest education level: Not on file  Occupational History   Occupation: retired  Tobacco Use   Smoking status: Never   Smokeless tobacco: Never  Vaping Use   Vaping Use: Never used  Substance and Sexual Activity   Alcohol use: Yes  Alcohol/week: 7.0 - 14.0 standard drinks of alcohol    Types: 7 - 14 Standard drinks or equivalent per week    Comment: wine with dinner   Drug use: Never   Sexual activity: Not Currently  Other Topics Concern   Not on file  Social History Narrative   Not on file   Social Determinants of Health   Financial Resource Strain: Low Risk  (03/28/2022)   Overall Financial Resource Strain (CARDIA)    Difficulty of Paying Living Expenses: Not hard at all  Food Insecurity: No Food Insecurity (03/28/2022)   Hunger Vital Sign    Worried About Running Out of Food in the Last Year: Never true    Ran Out of Food in the Last Year: Never true  Transportation Needs: No Transportation Needs (03/28/2022)   PRAPARE - Hydrologist (Medical): No    Lack of Transportation (Non-Medical):  No  Physical Activity: Insufficiently Active (03/28/2022)   Exercise Vital Sign    Days of Exercise per Week: 3 days    Minutes of Exercise per Session: 20 min  Stress: No Stress Concern Present (03/28/2022)   [place]    Feeling of Stress : Not at all  Social Connections: Unknown (03/28/2022)   Social Connection and Isolation Panel [NHANES]    Frequency of Communication with Friends and Family: Three times a week    Frequency of Social Gatherings with Friends and Family: Twice a week    Attends Religious Services: Not on Advertising copywriter or Organizations: No    Attends Archivist Meetings: Never    Marital Status: Never married    Tobacco Counseling Counseling given: Not Answered   Clinical Intake:  Pre-visit preparation completed: Yes  Pain : No/denies pain Pain Score: 0-No pain     BMI - recorded: 21.82 Nutritional Status: BMI of 19-24  Normal Nutritional Risks: None Diabetes: No  How often do you need to have someone help you when you read instructions, pamphlets, or other written materials from your doctor or pharmacy?: 1 - Never What is the last grade level you completed in school?: HSG  Diabetic? no  Interpreter Needed?: No  Information entered by :: Lisette Abu, LPN.   Activities of Daily Living    03/28/2022    2:29 PM 03/22/2022    1:40 PM  In your present state of health, do you have any difficulty performing the following activities:  Hearing? 0 0  Vision? 0 0  Difficulty concentrating or making decisions? 0 0  Walking or climbing stairs? 0 0  Dressing or bathing? 0 0  Doing errands, shopping? 0 0  Preparing Food and eating ? N N  Using the Toilet? N N  In the past six months, have you accidently leaked urine? N N  Do you have problems with loss of bowel control? N N  Managing your Medications? N N  Managing your Finances? N N  Housekeeping or  managing your Housekeeping? N N    Patient Care Team: Hoyt Koch, MD as PCP - General (Internal Medicine) Rutherford Guys, MD as Consulting Physician (Ophthalmology)  Indicate any recent Medical Services you may have received from other than [facility] providers in the past year (date may be approximate).     Assessment:   This is a routine wellness examination for [place].  Hearing/Vision screen Hearing Screening - Comments:: Denies hearing difficulties  Vision Screening - Comments:: Wears rx glasses - up to date with routine eye exams with Rutherford Guys, MD.   Dietary issues and exercise activities discussed: Current Exercise Habits: Home exercise routine, Time (Minutes): 20, Frequency (Times/Week): 3, Weekly Exercise (Minutes/Week): 60, Intensity: Moderate, Exercise limited by: None identified   Goals Addressed             This Visit's Progress    My goal for 2024 is to lose weight and control my cholesterol levels.        Depression Screen    03/28/2022    2:29 PM 02/09/2022    9:09 AM 08/11/2021    8:33 AM 03/17/2021   10:02 AM 12/28/2020    8:24 AM 05/19/2020    8:28 AM 03/11/2020    2:27 PM  PHQ 2/9 Scores  PHQ - 2 Score 0 0 0 0 0 0 0  PHQ- 9 Score 0 0 0        Fall Risk    03/28/2022    2:44 PM 03/28/2022    2:29 PM 03/22/2022    1:40 PM 02/09/2022    9:08 AM 08/11/2021    8:33 AM  Fall Risk   Falls in the past year? 0 1 1 1 1   Number falls in past yr: 0 0 0 0 0  Injury with Fall? 0 0 0 0 0  Risk for fall due to : No Fall Risks No Fall Risks  No Fall Risks   Follow up Falls prevention discussed Falls prevention discussed  Falls evaluation completed     FALL RISK PREVENTION PERTAINING TO THE HOME:  Any stairs in or around the home? Yes  If so, are there any without handrails? No  Home free of loose throw rugs in walkways, pet beds, electrical cords, etc? Yes  Adequate lighting in your home to reduce risk of falls? Yes   ASSISTIVE DEVICES UTILIZED TO  PREVENT FALLS:  Life alert? No  Use of a cane, walker or w/c? No  Grab bars in the bathroom? No  Shower chair or bench in shower? No  Elevated toilet seat or a handicapped toilet? No   TIMED UP AND GO:  Was the test performed? Yes .  Length of time to ambulate 10 feet: 8 sec.   Gait steady and fast without use of assistive device  Cognitive Function:        03/28/2022    2:30 PM 03/11/2020    2:31 PM  6CIT Screen  What Year? 0 points 0 points  What month? 0 points 0 points  What time? 0 points   Count back from 20 0 points 0 points  Months in reverse 0 points 0 points  Repeat phrase 0 points 0 points  Total Score 0 points     Immunizations Immunization History  Administered Date(s) Administered   Fluad Quad(high Dose 65+) 01/20/2021   Influenza, High Dose Seasonal PF 11/14/2018   Influenza-Unspecified 11/14/2017, 12/11/2019, 12/04/2021   PFIZER(Purple Top)SARS-COV-2 Vaccination 04/18/2019, 05/11/2019, 12/02/2019, 06/11/2020   PNEUMOCOCCAL CONJUGATE-20 03/17/2021   Pfizer Covid-19 Vaccine Bivalent Booster 83yr & up 11/22/2020, 11/27/2021   Pneumococcal Polysaccharide-23 10/15/2018   RSV,unspecified 11/20/2021   Tdap 01/29/2018   Zoster Recombinat (Shingrix) 12/31/2019, 02/29/2020    TDAP status: Up to date  Flu Vaccine status: Up to date  Pneumococcal vaccine status: Up to date  Covid-19 vaccine status: Completed vaccines  Qualifies for Shingles Vaccine? Yes   Zostavax completed No   Shingrix Completed?:  Yes  Screening Tests Health Maintenance  Topic Date Due   COVID-19 Vaccine (7 - 2023-24 season) 07/04/2022 (Originally 01/22/2022)   Medicare Annual Wellness ([place])  03/29/2023   MAMMOGRAM  08/03/2023   COLONOSCOPY (Pts 45-39yr Insurance coverage will need to be confirmed)  11/14/2024   DTaP/Tdap/Td (2 - Td or Tdap) 01/30/2028   Pneumonia Vaccine 74 Years old  Completed   INFLUENZA VACCINE  Completed   DEXA SCAN  Completed   Hepatitis C Screening   Completed   Zoster Vaccines- Shingrix  Completed   HPV VACCINES  Aged Out    Health Maintenance  There are no preventive care reminders to display for this patient.   Colorectal cancer screening: Type of screening: Colonoscopy. Completed 11/14/2021. Repeat every 3 years  Mammogram status: Completed 08/02/2021. Repeat every year  Bone Density status: Completed 06/30/2021. Results reflect: Bone density results: OSTEOPENIA. Repeat every 3-5 years.  Lung Cancer Screening: (Low Dose CT Chest recommended if Age 74-80years, 30 pack-year currently smoking OR have quit w/in 15years.) does not qualify.   Lung Cancer Screening Referral: no  Additional Screening:  Hepatitis C Screening: does qualify; Completed 01/29/2018  Vision Screening: Recommended annual ophthalmology exams for early detection of glaucoma and other disorders of the eye. Is the patient up to date with their annual eye exam?  Yes  Who is the provider or what is the name of the office in which the patient attends annual eye exams? MRutherford Guys MD. If pt is not established with a provider, would they like to be referred to a provider to establish care? No .   Dental Screening: Recommended annual dental exams for proper oral hygiene  Community Resource Referral / Chronic Care Management: CRR required this visit?  No   CCM required this visit?  No      Plan:     I have personally reviewed and noted the following in the patient's chart:   Medical and social history Use of alcohol, tobacco or illicit drugs  Current medications and supplements including opioid prescriptions. Patient is not currently taking opioid prescriptions. Functional ability and status Nutritional status Physical activity Advanced directives List of other physicians Hospitalizations, surgeries, and ER visits in previous 12 months Vitals Screenings to include cognitive, depression, and falls Referrals and appointments  In addition, I have  reviewed and discussed with patient certain preventive protocols, quality metrics, and best practice recommendations. A written personalized care plan for preventive services as well as general preventive health recommendations were provided to patient.     SSheral Flow LPN   [id number]  Nurse Notes: Normal cognitive status assessed by direct observation by this Nurse Health Advisor. No abnormalities found.

## 2022-03-28 NOTE — Telephone Encounter (Signed)
Patient is requesting a lab order to check cholesterol levels in March.  Advised that message would have to be sent and approved by pcp.

## 2022-03-30 NOTE — Addendum Note (Signed)
Addended by: Pricilla Holm A on: 03/30/2022 12:33 PM   Modules accepted: Orders

## 2022-03-30 NOTE — Telephone Encounter (Signed)
Labs placed ok to schedule lab visit for March

## 2022-05-15 ENCOUNTER — Other Ambulatory Visit (INDEPENDENT_AMBULATORY_CARE_PROVIDER_SITE_OTHER): Payer: PPO

## 2022-05-15 DIAGNOSIS — E782 Mixed hyperlipidemia: Secondary | ICD-10-CM | POA: Diagnosis not present

## 2022-05-15 LAB — LIPID PANEL
Cholesterol: 180 mg/dL (ref 0–200)
HDL: 72.2 mg/dL (ref >39.00)
LDL Cholesterol: 91 mg/dL (ref 0–99)
NonHDL: 108.18
Total CHOL/HDL Ratio: 2
Triglycerides: 87 mg/dL (ref 0.0–149.0)
VLDL: 17.4 mg/dL (ref 0.0–40.0)

## 2022-05-19 IMAGING — MG MM DIGITAL SCREENING BILAT W/ TOMO AND CAD
6 of 12 series · 6 of 36 positions shown · non-contrast
Comparison: Previous exam(s).

CLINICAL DATA: Screening.

EXAM:
DIGITAL SCREENING BILATERAL MAMMOGRAM WITH TOMOSYNTHESIS AND CAD
TECHNIQUE: Bilateral screening digital craniocaudal and mediolateral oblique
mammograms were obtained. Bilateral screening digital breast
tomosynthesis was performed. The images were evaluated with
computer-aided detection.

[L MLO synth-2D (1 of 2)]
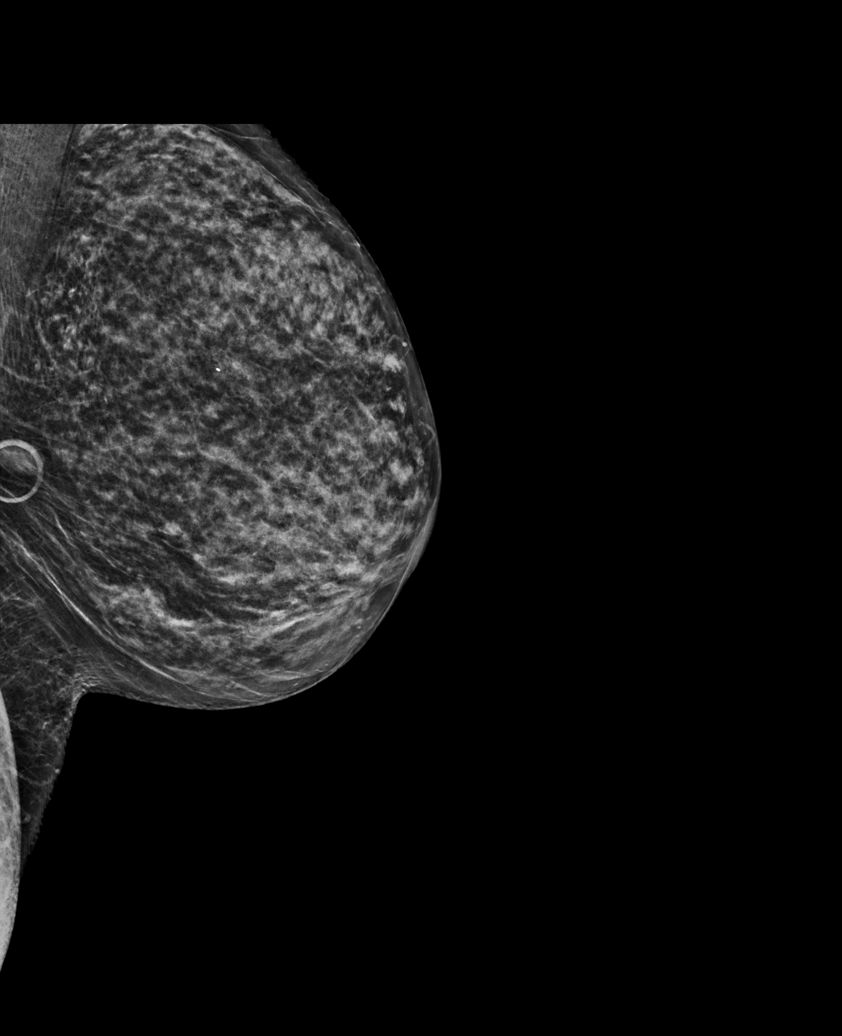

[R CC synth-2D (1 of 2)]
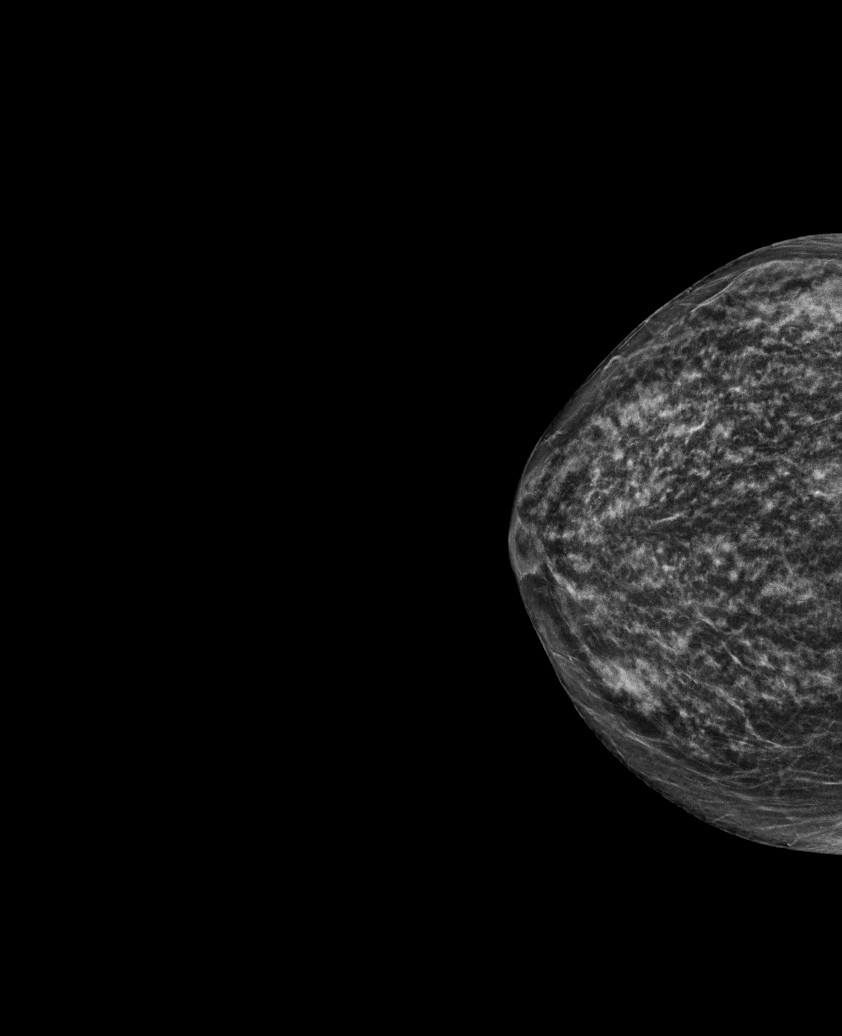

[R MLO synth-2D]
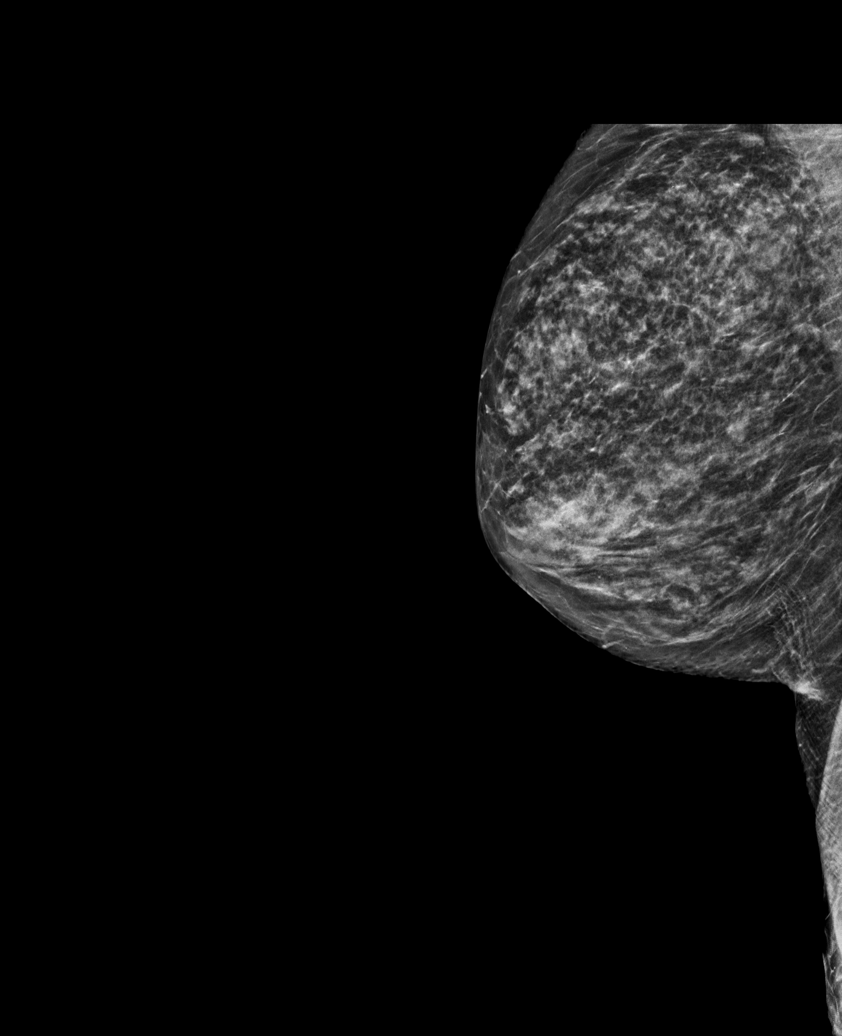

[L MLO synth-2D (2 of 2)]
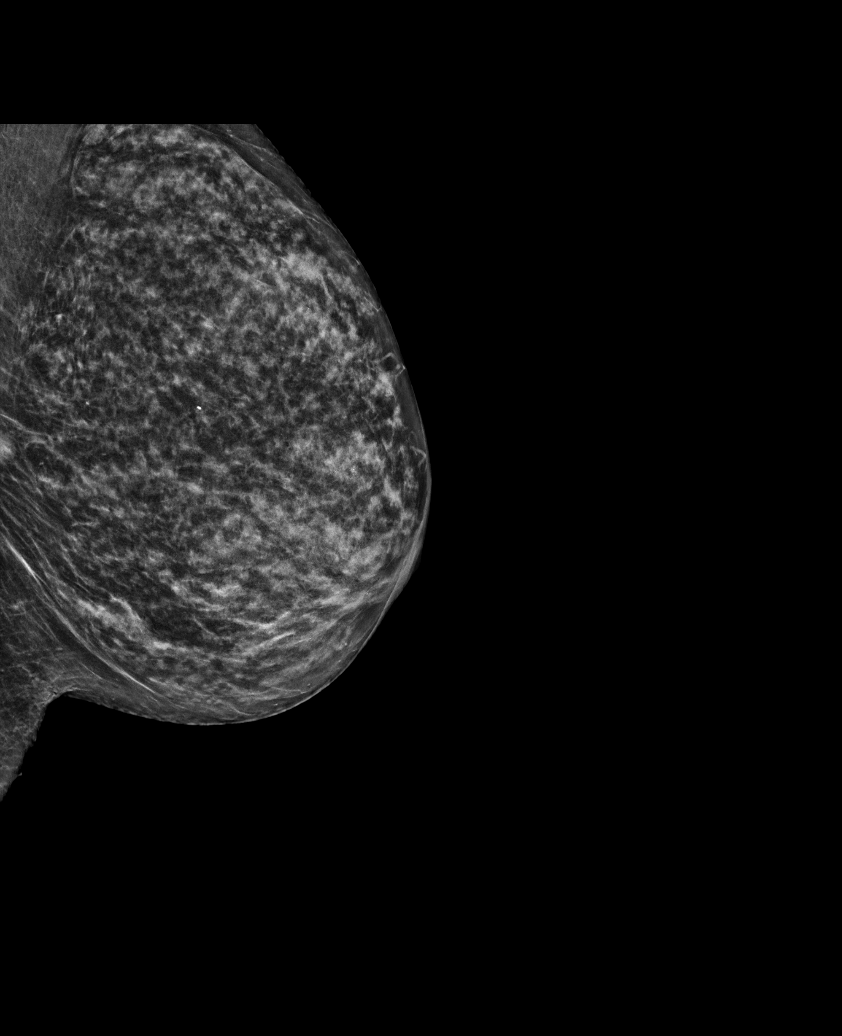

[L CC synth-2D]
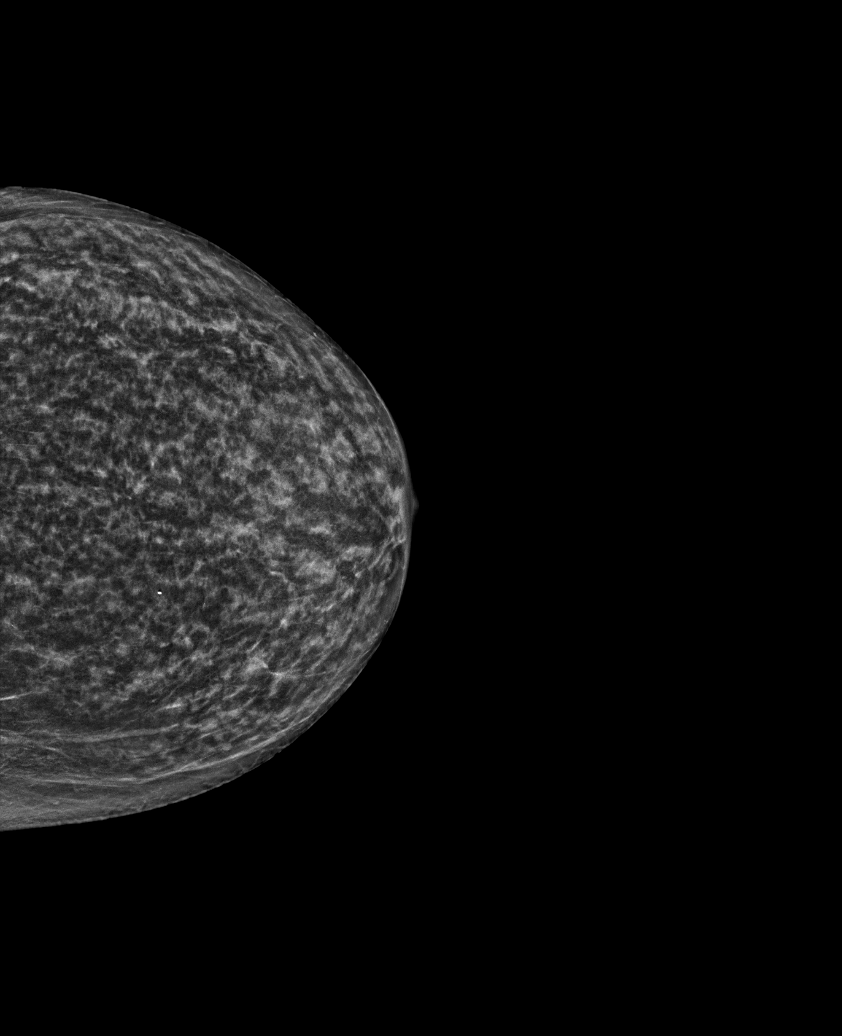

[R CC synth-2D (2 of 2)]
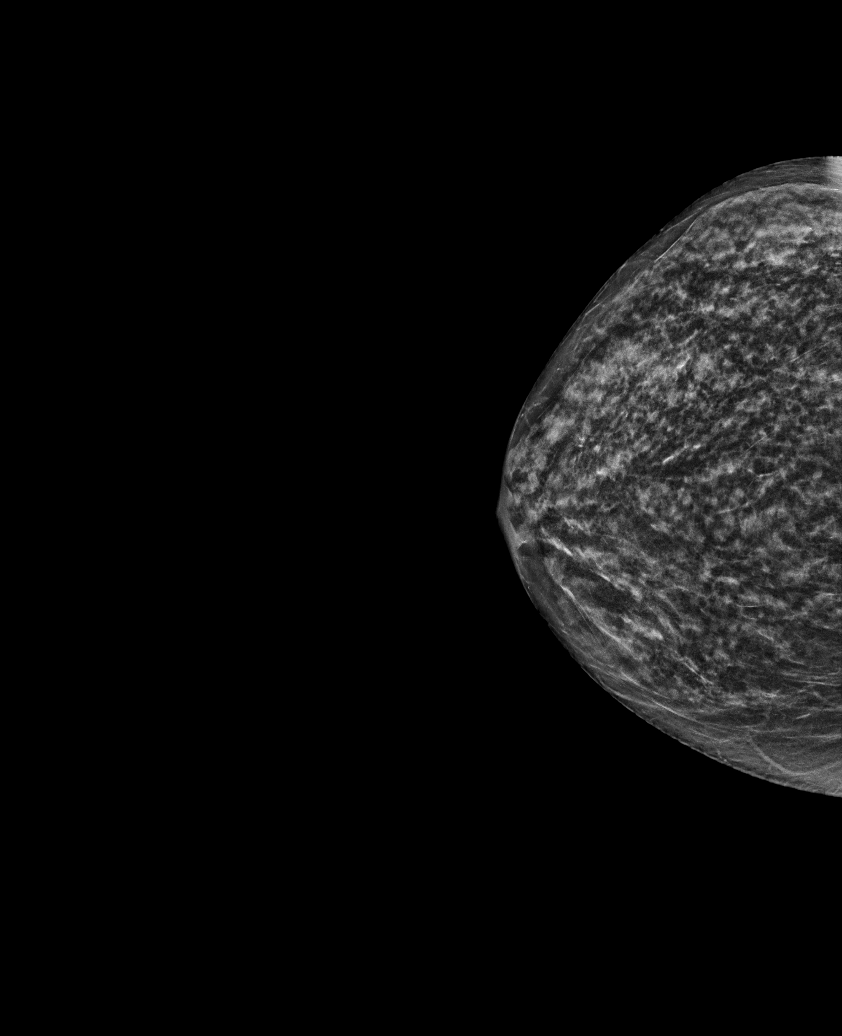

[6 of 36 positions shown; findings below may reference images not displayed]

ACR Breast Density Category c: The breast tissue is heterogeneously
dense, which may obscure small masses.
FINDINGS: There are no findings suspicious for malignancy. The images were
evaluated with computer-aided detection.
IMPRESSION: No mammographic evidence of malignancy. A result letter of this
screening mammogram will be mailed directly to the patient.

RECOMMENDATION:
Screening mammogram in one year. (Code:T4-5-GWO)

BI-RADS CATEGORY  1: Negative.

## 2022-06-20 ENCOUNTER — Other Ambulatory Visit: Payer: Self-pay | Admitting: Internal Medicine

## 2022-06-20 DIAGNOSIS — Z1231 Encounter for screening mammogram for malignant neoplasm of breast: Secondary | ICD-10-CM

## 2022-07-26 DIAGNOSIS — H35371 Puckering of macula, right eye: Secondary | ICD-10-CM | POA: Diagnosis not present

## 2022-07-26 DIAGNOSIS — H43391 Other vitreous opacities, right eye: Secondary | ICD-10-CM | POA: Diagnosis not present

## 2022-07-26 DIAGNOSIS — H35372 Puckering of macula, left eye: Secondary | ICD-10-CM | POA: Diagnosis not present

## 2022-08-06 ENCOUNTER — Ambulatory Visit
Admission: RE | Admit: 2022-08-06 | Discharge: 2022-08-06 | Disposition: A | Discharge status: Home / Self Care | Payer: PPO | Source: Ambulatory Visit | Attending: Internal Medicine | Admitting: Internal Medicine

## 2022-08-06 DIAGNOSIS — Z1231 Encounter for screening mammogram for malignant neoplasm of breast: Secondary | ICD-10-CM | POA: Diagnosis not present

## 2022-08-13 ENCOUNTER — Ambulatory Visit (INDEPENDENT_AMBULATORY_CARE_PROVIDER_SITE_OTHER): Payer: PPO | Admitting: Internal Medicine

## 2022-08-13 ENCOUNTER — Encounter: Payer: Self-pay | Admitting: Internal Medicine

## 2022-08-13 VITALS — BP 138/86 | HR 64 | Temp 98.5°F | Ht 66.0 in | Wt 136.0 lb

## 2022-08-13 DIAGNOSIS — E782 Mixed hyperlipidemia: Secondary | ICD-10-CM

## 2022-08-13 DIAGNOSIS — I10 Essential (primary) hypertension: Secondary | ICD-10-CM | POA: Diagnosis not present

## 2022-08-13 LAB — LIPID PANEL
Cholesterol: 184 mg/dL (ref 0–200)
HDL: 75.6 mg/dL (ref >39.00)
LDL Cholesterol: 93 mg/dL (ref 0–99)
NonHDL: 108.47
Total CHOL/HDL Ratio: 2
Triglycerides: 76 mg/dL (ref 0.0–149.0)
VLDL: 15.2 mg/dL (ref 0.0–40.0)

## 2022-08-13 LAB — COMPREHENSIVE METABOLIC PANEL
ALT: 8 U/L (ref 0–35)
AST: 16 U/L (ref 0–37)
Albumin: 4.5 g/dL (ref 3.5–5.2)
Alkaline Phosphatase: 31 U/L — ABNORMAL LOW (ref 39–117)
BUN: 12 mg/dL (ref 6–23)
CO2: 31 mEq/L (ref 19–32)
Calcium: 9.4 mg/dL (ref 8.4–10.5)
Chloride: 98 mEq/L (ref 96–112)
Creatinine, Ser: 0.75 mg/dL (ref 0.40–1.20)
GFR: 78.94 mL/min (ref >60.00)
Glucose, Bld: 97 mg/dL (ref 70–99)
Potassium: 4.6 mEq/L (ref 3.5–5.1)
Sodium: 135 mEq/L (ref 135–145)
Total Bilirubin: 0.5 mg/dL (ref 0.2–1.2)
Total Protein: 7 g/dL (ref 6.0–8.3)

## 2022-08-13 LAB — CBC
HCT: 40.6 % (ref 36.0–46.0)
Hemoglobin: 13.3 g/dL (ref 12.0–15.0)
MCHC: 32.7 g/dL (ref 30.0–36.0)
MCV: 94 fl (ref 78.0–100.0)
Platelets: 342 10*3/uL (ref 150.0–400.0)
RBC: 4.31 Mil/uL (ref 3.87–5.11)
RDW: 14.2 % (ref 11.5–15.5)
WBC: 4.7 10*3/uL (ref 4.0–10.5)

## 2022-08-13 NOTE — Assessment & Plan Note (Signed)
Has started pravastatin 20 mg daily. No side effects.Checking lipid panel and CMP today and adjust dosing as needed.

## 2022-08-13 NOTE — Progress Notes (Signed)
   Subjective:   Patient ID: Elizabeth Vincent, female    DOB: [birth year], 72 y.o.   MRN: [id number]  HPI The patient is a 72 YO female coming in for follow up. Reduced lisinopril to 20 mg daily without problems. No new lightheadedness episodes. Started pravastatin without problems.   Review of Systems  Constitutional: Negative.   HENT: Negative.    Eyes: Negative.   Respiratory:  Negative for cough, chest tightness and shortness of breath.   Cardiovascular:  Negative for chest pain, palpitations and leg swelling.  Gastrointestinal:  Negative for abdominal distention, abdominal pain, constipation, diarrhea, nausea and vomiting.  Musculoskeletal:  Positive for arthralgias.  Skin: Negative.   Neurological: Negative.   Psychiatric/Behavioral: Negative.      Objective:  Physical Exam Constitutional:      Appearance: She is well-developed.  HENT:     Head: Normocephalic and atraumatic.  Cardiovascular:     Rate and Rhythm: Normal rate and regular rhythm.  Pulmonary:     Effort: Pulmonary effort is normal. No respiratory distress.     Breath sounds: Normal breath sounds. No wheezing or rales.  Abdominal:     General: Bowel sounds are normal. There is no distension.     Palpations: Abdomen is soft.     Tenderness: There is no abdominal tenderness. There is no rebound.  Musculoskeletal:        General: Tenderness present.     Cervical back: Normal range of motion.  Skin:    General: Skin is warm and dry.  Neurological:     Mental Status: She is alert and oriented to person, place, and time.     Coordination: Coordination normal.     Vitals:   08/13/22 0821  BP: 138/86  Pulse: 64  Temp: 98.5 F (36.9 C)  TempSrc: Oral  SpO2: 99%  Weight: 136 lb (61.7 kg)  Height: 5\' 6"  (1.676 m)    Assessment & Plan:

## 2022-08-13 NOTE — Patient Instructions (Addendum)
We will recheck the cholesterol today.

## 2022-08-13 NOTE — Assessment & Plan Note (Signed)
BP at goal on lisinopril 20 mg daily. Checking CBC and CMP. Adjust as needed.

## 2023-02-04 ENCOUNTER — Other Ambulatory Visit: Payer: Self-pay | Admitting: Internal Medicine

## 2023-02-11 ENCOUNTER — Encounter: Payer: PPO | Admitting: Internal Medicine

## 2023-02-19 ENCOUNTER — Ambulatory Visit (INDEPENDENT_AMBULATORY_CARE_PROVIDER_SITE_OTHER): Payer: PPO | Admitting: Internal Medicine

## 2023-02-19 ENCOUNTER — Encounter: Payer: Self-pay | Admitting: Internal Medicine

## 2023-02-19 VITALS — BP 140/82 | HR 70 | Temp 98.4°F | Ht 66.0 in | Wt 136.0 lb

## 2023-02-19 DIAGNOSIS — Z0000 Encounter for general adult medical examination without abnormal findings: Secondary | ICD-10-CM

## 2023-02-19 DIAGNOSIS — E782 Mixed hyperlipidemia: Secondary | ICD-10-CM | POA: Diagnosis not present

## 2023-02-19 DIAGNOSIS — C84A0 Cutaneous T-cell lymphoma, unspecified, unspecified site: Secondary | ICD-10-CM | POA: Diagnosis not present

## 2023-02-19 DIAGNOSIS — I10 Essential (primary) hypertension: Secondary | ICD-10-CM | POA: Diagnosis not present

## 2023-02-19 LAB — CBC
HCT: 39.9 % (ref 36.0–46.0)
Hemoglobin: 13.4 g/dL (ref 12.0–15.0)
MCHC: 33.6 g/dL (ref 30.0–36.0)
MCV: 95.2 fL (ref 78.0–100.0)
Platelets: 312 10*3/uL (ref 150.0–400.0)
RBC: 4.19 Mil/uL (ref 3.87–5.11)
RDW: 14.1 % (ref 11.5–15.5)
WBC: 4.2 10*3/uL (ref 4.0–10.5)

## 2023-02-19 LAB — COMPREHENSIVE METABOLIC PANEL
ALT: 10 U/L (ref 0–35)
AST: 18 U/L (ref 0–37)
Albumin: 4.5 g/dL (ref 3.5–5.2)
Alkaline Phosphatase: 32 U/L — ABNORMAL LOW (ref 39–117)
BUN: 11 mg/dL (ref 6–23)
CO2: 30 meq/L (ref 19–32)
Calcium: 9.3 mg/dL (ref 8.4–10.5)
Chloride: 98 meq/L (ref 96–112)
Creatinine, Ser: 0.76 mg/dL (ref 0.40–1.20)
GFR: 77.41 mL/min (ref >60.00)
Glucose, Bld: 88 mg/dL (ref 70–99)
Potassium: 4.1 meq/L (ref 3.5–5.1)
Sodium: 138 meq/L (ref 135–145)
Total Bilirubin: 0.6 mg/dL (ref 0.2–1.2)
Total Protein: 7 g/dL (ref 6.0–8.3)

## 2023-02-19 LAB — LIPID PANEL
Cholesterol: 185 mg/dL (ref 0–200)
HDL: 72.9 mg/dL (ref >39.00)
LDL Cholesterol: 101 mg/dL — ABNORMAL HIGH (ref 0–99)
NonHDL: 112.34
Total CHOL/HDL Ratio: 3
Triglycerides: 58 mg/dL (ref 0.0–149.0)
VLDL: 11.6 mg/dL (ref 0.0–40.0)

## 2023-02-19 MED ORDER — PRAVASTATIN SODIUM 20 MG PO TABS: 20.0000 mg | ORAL_TABLET | Freq: Every day | ORAL | 3 refills | Status: AC

## 2023-02-19 MED ORDER — LISINOPRIL 20 MG PO TABS: 20.0000 mg | ORAL_TABLET | Freq: Every day | ORAL | 3 refills | Status: AC

## 2023-02-19 NOTE — Assessment & Plan Note (Signed)
Taking pravastatin 20 mg daily and will continue last lipid at goal.

## 2023-02-19 NOTE — Progress Notes (Signed)
   Subjective:   Patient ID: Elizabeth Vincent, female    DOB: 1950/08/13, 72 y.o.   MRN: [id number]  HPI The patient is here for physical.  PMH, [place], social history reviewed and updated  Review of Systems  Constitutional: Negative.   HENT: Negative.    Eyes: Negative.   Respiratory:  Negative for cough, chest tightness and shortness of breath.   Cardiovascular:  Negative for chest pain, palpitations and leg swelling.  Gastrointestinal:  Negative for abdominal distention, abdominal pain, constipation, diarrhea, nausea and vomiting.  Musculoskeletal: Negative.   Skin: Negative.   Neurological: Negative.   Psychiatric/Behavioral: Negative.      Objective:  Physical Exam Constitutional:      Appearance: She is well-developed.  HENT:     Head: Normocephalic and atraumatic.  Cardiovascular:     Rate and Rhythm: Normal rate and regular rhythm.  Pulmonary:     Effort: Pulmonary effort is normal. No respiratory distress.     Breath sounds: Normal breath sounds. No wheezing or rales.  Abdominal:     General: Bowel sounds are normal. There is no distension.     Palpations: Abdomen is soft.     Tenderness: There is no abdominal tenderness. There is no rebound.  Musculoskeletal:     Cervical back: Normal range of motion.  Skin:    General: Skin is warm and dry.  Neurological:     Mental Status: She is alert and oriented to person, place, and time.     Coordination: Coordination normal.     Vitals:   02/19/23 0757 02/19/23 0805  BP: (!) 140/82 (!) 140/82  Pulse: 70   Temp: 98.4 F (36.9 C)   TempSrc: Oral   SpO2: 94%   Weight: 136 lb (61.7 kg)   Height: 5\' 6"  (1.676 m)     Assessment & Plan:

## 2023-02-19 NOTE — Assessment & Plan Note (Signed)
BP at goal on lisinopril 20 mg daily and labs up to date. Continue.

## 2023-02-19 NOTE — Assessment & Plan Note (Signed)
Stable without recurrence.

## 2023-02-19 NOTE — Assessment & Plan Note (Signed)
Flu shot complete for season. Pneumonia complete. Shingrix complete. Tetanus due 2029. Colonoscopy due 2026. Mammogram due 2025, pap smear aged out and dexa due 2026. Counseled about sun safety and mole surveillance. Counseled about the dangers of distracted driving. Given 10 year screening recommendations.

## 2023-03-27 ENCOUNTER — Ambulatory Visit: Payer: PPO

## 2023-03-27 VITALS — BP 128/80 | HR 73 | Ht 66.5 in | Wt 136.4 lb

## 2023-03-27 DIAGNOSIS — Z0000 Encounter for general adult medical examination without abnormal findings: Secondary | ICD-10-CM

## 2023-03-27 NOTE — Patient Instructions (Addendum)
Ms. Rohrer , Thank you for taking time to come for your Medicare Wellness Visit. I appreciate your ongoing commitment to your health goals. Please review the following plan we discussed and let me know if I can assist you in the future.   Referrals/Orders/Follow-Ups/Clinician Recommendations: It was nice to meet you today.  Each day, aim for 6 glasses of water, plenty of protein in your diet and try to get up and walk/ stretch every hour for 5-10 minutes at a time.  I place a request for a referral for an Ophthalmologist.   This is a list of the screening recommended for you and due dates:  Health Maintenance  Topic Date Due   COVID-19 Vaccine (8 - 2024-25 season) 04/02/2023   Medicare Annual Wellness Visit  03/26/2024   Mammogram  08/05/2024   Colon Cancer Screening  11/14/2024   DTaP/Tdap/Td vaccine (2 - Td or Tdap) 01/30/2028   Pneumonia Vaccine  Completed   Flu Shot  Completed   DEXA scan (bone density measurement)  Completed   Hepatitis C Screening  Completed   Zoster (Shingles) Vaccine  Completed   HPV Vaccine  Aged Out    Advanced directives: (Copy Requested) Please bring a copy of your health care power of attorney and living will to the office to be added to your chart at your convenience.  Next Medicare Annual Wellness Visit scheduled for next year: Yes

## 2023-03-27 NOTE — Progress Notes (Signed)
Subjective:   Elizabeth Vincent is a 73 y.o. female who presents for Medicare Annual (Subsequent) preventive examination.  Visit Complete: In person  Patient Medicare AWV questionnaire was completed by the patient on 03/24/2023; I have confirmed that all information answered by patient is correct and no changes since this date.  Cardiac Risk Factors include: advanced age (>38men, >37 women);hypertension;dyslipidemia     Objective:    Today's Vitals   03/27/23 0959 03/27/23 1015  BP: (!) 150/90 128/80  Pulse: 73   SpO2: 96%   Weight: 136 lb 6.4 oz (61.9 kg)   Height: 5' 6.5" (1.689 m)    Body mass index is 21.69 kg/m.     03/27/2023    9:43 AM 03/28/2022    2:42 PM 03/17/2021   10:13 AM 03/11/2020    2:28 PM  Advanced Directives  Does Patient Have a Medical Advance Directive? Yes No No No  Type of Estate agent of [place];Living will     Does patient want to make changes to medical advance directive?    Yes (MAU/Ambulatory/Procedural Areas - Information given)  Copy of Healthcare Power of Attorney in Chart? No - copy requested     Would patient like information on creating a medical advance directive?  No - Patient declined No - Patient declined     Current Medications (verified) Outpatient Encounter Medications as of 03/27/2023  Medication Sig   Calcium-Magnesium-Vitamin D (CALCIUM 1200+D3 PO) Take 1 tablet by mouth in the morning and at bedtime.   lisinopril (ZESTRIL) 20 MG tablet Take 1 tablet (20 mg total) by mouth daily.   Multiple Vitamins-Minerals (PRESERVISION AREDS 2+MULTI VIT PO) Take 1 tablet by mouth in the morning and at bedtime.   pravastatin (PRAVACHOL) 20 MG tablet Take 1 tablet (20 mg total) by mouth daily.   olopatadine (PATADAY) 0.1 % ophthalmic solution Place 1 drop into both eyes daily as needed. (Patient not taking: Reported on 03/27/2023)   No facility-administered encounter medications on file as of 03/27/2023.    Allergies  (verified) Patient has no known allergies.   History: Past Medical History:  Diagnosis Date   Cancer (HCC)    T cell Lymphoma   Cataract    bilateral lens implants   Colon polyps    Hyperlipidemia    Hypertension    on meds   Osteoporosis    Seasonal allergies    Past Surgical History:  Procedure Laterality Date   BREAST BIOPSY     CATARACT EXTRACTION, BILATERAL     COLONOSCOPY  08/29/2020   [facility](good)-hems/tics/piecemeal polyp (2 cecal)-1 yr recall   EYE SURGERY     as a child   SKIN BIOPSY     Family History  Problem Relation Age of Onset   Cancer Mother    Early death Mother    Heart disease Father    Alcohol abuse Sister    Cancer Sister    Cancer Maternal Grandmother    Hearing loss Maternal Grandmother    Cancer Maternal Grandfather    Early death Maternal Grandfather    Heart disease Paternal Grandmother    Cancer Paternal Grandfather    Stroke Paternal Grandfather    Colon polyps Maternal Uncle 78   Colon cancer Maternal Uncle 78   Lung cancer Maternal Uncle    Breast cancer Sister    Arthritis Maternal Aunt    Cancer Maternal Aunt    Miscarriages / Stillbirths Maternal Aunt    Vision loss Maternal  Aunt    Cancer Maternal Uncle    Diabetes Maternal Aunt    Hearing loss Maternal Aunt    Vision loss Maternal Aunt    Rectal cancer Neg Hx    Stomach cancer Neg Hx    Esophageal cancer Neg Hx    Social History   Socioeconomic History   Marital status: Single    Spouse name: Not on file   Number of children: 0   Years of education: Not on file   Highest education level: Bachelor's degree (e.g., BA, AB, BS)  Occupational History   Occupation: retired  Tobacco Use   Smoking status: Never   Smokeless tobacco: Never  Vaping Use   Vaping status: Never Used  Substance and Sexual Activity   Alcohol use: Yes    Alcohol/week: 7.0 standard drinks of alcohol    Types: 7 Glasses of wine per week    Comment: wine with dinner   Drug use: Never    Sexual activity: Not Currently  Other Topics Concern   Not on file  Social History Narrative   Lives alone-2025   Social Drivers of Health   Financial Resource Strain: Low Risk  (03/27/2023)   Overall Financial Resource Strain (CARDIA)    Difficulty of Paying Living Expenses: Not hard at all  Food Insecurity: No Food Insecurity (03/27/2023)   Hunger Vital Sign    Worried About Running Out of Food in the Last Year: Never true    Ran Out of Food in the Last Year: Never true  Transportation Needs: No Transportation Needs (03/27/2023)   PRAPARE - Administrator, Civil Service (Medical): No    Lack of Transportation (Non-Medical): No  Physical Activity: Inactive (03/27/2023)   Exercise Vital Sign    Days of Exercise per Week: 0 days    Minutes of Exercise per Session: 0 min  Stress: No Stress Concern Present (03/27/2023)   [employer] of Occupational Health - Occupational Stress Questionnaire    Feeling of Stress : Not at all  Social Connections: Moderately Integrated (03/27/2023)   Social Connection and Isolation Panel [NHANES]    Frequency of Communication with Friends and Family: Three times a week    Frequency of Social Gatherings with Friends and Family: Twice a week    Attends Religious Services: More than 4 times per year    Active Member of [employer] or Organizations: Yes    Attends Banker Meetings: Never    Marital Status: Never married  Recent Concern: Social Connections - Moderately Isolated (02/15/2023)   Social Connection and Isolation Panel [NHANES]    Frequency of Communication with Friends and Family: Three times a week    Frequency of Social Gatherings with Friends and Family: Twice a week    Attends Religious Services: More than 4 times per year    Active Member of [employer] or Organizations: No    Attends Engineer, structural: Never    Marital Status: Never married    Tobacco Counseling Counseling given: Not Answered   Clinical  Intake:  Pre-visit preparation completed: Yes  Pain : No/denies pain     Nutritional Risks: None Diabetes: No  How often do you need to have someone help you when you read instructions, pamphlets, or other written materials from your doctor or pharmacy?: 1 - Never  Interpreter Needed?: No  Information entered by :: Elizabeth Vincent, RMA   Activities of Daily Living    03/24/2023    6:41 AM  03/28/2022    2:29 PM  In your present state of health, do you have any difficulty performing the following activities:  Hearing? 0 0  Vision? 0 0  Difficulty concentrating or making decisions? 0 0  Walking or climbing stairs? 0 0  Dressing or bathing? 0 0  Doing errands, shopping? 0 0  Preparing Food and eating ? N N  Using the Toilet? N N  In the past six months, have you accidently leaked urine? N N  Do you have problems with loss of bowel control? N N  Managing your Medications? N N  Managing your Finances? N N  Housekeeping or managing your Housekeeping? N N    Patient Care Team: Myrlene Broker, MD as PCP - General (Internal Medicine) Jethro Bolus, MD as Consulting Physician (Ophthalmology)  Indicate any recent Medical Services you may have received from other than [facility] providers in the past year (date may be approximate).     Assessment:   This is a routine wellness examination for [place].  Hearing/Vision screen Hearing Screening - Comments:: Denies hearing difficulties   Vision Screening - Comments:: Wears eyeglasses    Goals Addressed             This Visit's Progress    My goal for 2024 is to lose weight and control my cholesterol levels.   On track     Depression Screen    03/27/2023    9:47 AM 03/28/2022    2:29 PM 02/09/2022    9:09 AM 08/11/2021    8:33 AM 03/17/2021   10:02 AM 12/28/2020    8:24 AM 05/19/2020    8:28 AM  PHQ 2/9 Scores  PHQ - 2 Score 0 0 0 0 0 0 0  PHQ- 9 Score 1 0 0 0       Fall Risk    03/24/2023    6:41 AM 02/19/2023     8:05 AM 08/13/2022    8:23 AM 03/28/2022    2:44 PM 03/28/2022    2:29 PM  Fall Risk   Falls in the past year? 1 1 1  0 1  Number falls in past yr: 0 0 0 0 0  Injury with Fall? 0 1 0 0 0  Risk for fall due to :    No Fall Risks No Fall Risks  Follow up Falls evaluation completed;Falls prevention discussed Falls evaluation completed Falls evaluation completed Falls prevention discussed Falls prevention discussed    MEDICARE RISK AT HOME: Medicare Risk at Home Any stairs in or around the home?: (Patient-Rptd) Yes If so, are there any without handrails?: (Patient-Rptd) No Home free of loose throw rugs in walkways, pet beds, electrical cords, etc?: (Patient-Rptd) Yes Adequate lighting in your home to reduce risk of falls?: (Patient-Rptd) Yes Life alert?: (Patient-Rptd) No Use of a cane, walker or w/c?: (Patient-Rptd) No Grab bars in the bathroom?: (Patient-Rptd) No Shower chair or bench in shower?: (Patient-Rptd) No Elevated toilet seat or a handicapped toilet?: (Patient-Rptd) No  TIMED UP AND GO:  Was the test performed?  Yes  Length of time to ambulate 10 feet: 15 sec Gait steady and fast without use of assistive device    Cognitive Function:        03/27/2023    9:26 AM 03/28/2022    2:30 PM 03/11/2020    2:31 PM  6CIT Screen  What Year? 0 points 0 points 0 points  What month? 0 points 0 points 0 points  What time?  0 points 0 points   Count back from 20 0 points 0 points 0 points  Months in reverse 0 points 0 points 0 points  Repeat phrase 0 points 0 points 0 points  Total Score 0 points 0 points     Immunizations Immunization History  Administered Date(s) Administered   Fluad Quad(high Dose 65+) 01/20/2021   Influenza, High Dose Seasonal PF 11/14/2018   Influenza-Unspecified 11/14/2017, 12/11/2019, 12/04/2021, 02/05/2023   PFIZER(Purple Top)SARS-COV-2 Vaccination 04/18/2019, 05/11/2019, 12/02/2019, 06/11/2020   PNEUMOCOCCAL CONJUGATE-20 03/17/2021   Pfizer Covid-19  Vaccine Bivalent Booster 49yrs & up 11/22/2020, 11/27/2021   Pneumococcal Polysaccharide-23 10/15/2018   RSV,unspecified 11/20/2021   Tdap 01/29/2018   Unspecified SARS-COV-2 Vaccination 02/05/2023   Zoster Recombinant(Shingrix) 12/31/2019, 02/29/2020    TDAP status: Up to date  Flu Vaccine status: Up to date  Pneumococcal vaccine status: Up to date  Covid-19 vaccine status: Completed vaccines  Qualifies for Shingles Vaccine? Yes   Zostavax completed Yes   Shingrix Completed?: Yes  Screening Tests Health Maintenance  Topic Date Due   COVID-19 Vaccine (8 - 2024-25 season) 04/02/2023   Medicare Annual Wellness (AWV)  03/26/2024   MAMMOGRAM  08/05/2024   Colonoscopy  11/14/2024   DTaP/Tdap/Td (2 - Td or Tdap) 01/30/2028   Pneumonia Vaccine 104+ Years old  Completed   INFLUENZA VACCINE  Completed   DEXA SCAN  Completed   Hepatitis C Screening  Completed   Zoster Vaccines- Shingrix  Completed   HPV VACCINES  Aged Out    Health Maintenance  There are no preventive care reminders to display for this patient.   Colorectal cancer screening: Type of screening: Colonoscopy. Completed 11/14/2021. Repeat every 3 years  Mammogram status: Completed 08/06/2022. Repeat every year  Bone Density status: Completed 06/30/2021. Results reflect: Bone density results: OSTEOPENIA. Repeat every 3-5 years.  Lung Cancer Screening: (Low Dose CT Chest recommended if Age 80-80 years, 20 pack-year currently smoking OR have quit w/in 15years.) does not qualify.   Lung Cancer Screening Referral: N/A  Additional Screening:  Hepatitis C Screening: does qualify; Completed 01/29/2018  Vision Screening: Recommended annual ophthalmology exams for early detection of glaucoma and other disorders of the eye. Is the patient up to date with their annual eye exam?  No  Who is the provider or what is the name of the office in which the patient attends annual eye exams? N/A If pt is not established with a  provider, would they like to be referred to a provider to establish care? Yes .   Dental Screening: Recommended annual dental exams for proper oral hygiene   Community Resource Referral / Chronic Care Management: CRR required this visit?  No   CCM required this visit?  No     Plan:     I have personally reviewed and noted the following in the patient's chart:   Medical and social history Use of alcohol, tobacco or illicit drugs  Current medications and supplements including opioid prescriptions. Patient is not currently taking opioid prescriptions. Functional ability and status Nutritional status Physical activity Advanced directives List of other physicians Hospitalizations, surgeries, and ER visits in previous 12 months Vitals Screenings to include cognitive, depression, and falls Referrals and appointments  In addition, I have reviewed and discussed with patient certain preventive protocols, quality metrics, and best practice recommendations. A written personalized care plan for preventive services as well as general preventive health recommendations were provided to patient.     Wyvonne Lenz, CMA   03/27/2023  After Visit Summary: (MyChart) Due to this being a telephonic visit, the after visit summary with patients personalized plan was offered to patient via MyChart   Nurse Notes: Patient is up to date on all health maintenance.  She is due for an eye exam and would like a referral to see a Ophthalmologist.  Patient had no other concerns to address today.

## 2023-05-29 DIAGNOSIS — Z961 Presence of intraocular lens: Secondary | ICD-10-CM | POA: Diagnosis not present

## 2023-05-29 DIAGNOSIS — H532 Diplopia: Secondary | ICD-10-CM | POA: Diagnosis not present

## 2023-05-29 DIAGNOSIS — H5203 Hypermetropia, bilateral: Secondary | ICD-10-CM | POA: Diagnosis not present

## 2023-07-30 DIAGNOSIS — H43391 Other vitreous opacities, right eye: Secondary | ICD-10-CM | POA: Diagnosis not present

## 2023-07-30 DIAGNOSIS — H35373 Puckering of macula, bilateral: Secondary | ICD-10-CM | POA: Diagnosis not present

## 2023-09-10 ENCOUNTER — Other Ambulatory Visit: Payer: Self-pay | Admitting: Internal Medicine

## 2023-09-10 DIAGNOSIS — Z1231 Encounter for screening mammogram for malignant neoplasm of breast: Secondary | ICD-10-CM

## 2023-09-11 ENCOUNTER — Inpatient Hospital Stay
Admission: RE | Admit: 2023-09-11 | Discharge: 2023-09-11 | Discharge status: Home / Self Care | Source: Ambulatory Visit | Attending: Internal Medicine | Admitting: Internal Medicine

## 2023-09-11 DIAGNOSIS — Z1231 Encounter for screening mammogram for malignant neoplasm of breast: Secondary | ICD-10-CM

## 2023-09-16 ENCOUNTER — Ambulatory Visit: Payer: Self-pay | Admitting: Internal Medicine

## 2023-09-16 LAB — HM MAMMOGRAPHY

## 2023-09-29 ENCOUNTER — Encounter: Payer: Self-pay | Admitting: Internal Medicine

## 2024-03-02 ENCOUNTER — Ambulatory Visit: Payer: Self-pay | Admitting: Internal Medicine

## 2024-03-02 ENCOUNTER — Ambulatory Visit: Admitting: Internal Medicine

## 2024-03-02 ENCOUNTER — Encounter: Payer: Self-pay | Admitting: Internal Medicine

## 2024-03-02 VITALS — BP 130/80 | HR 67 | Temp 98.4°F | Ht 66.5 in | Wt 135.6 lb

## 2024-03-02 DIAGNOSIS — Z8572 Personal history of non-Hodgkin lymphomas: Secondary | ICD-10-CM | POA: Diagnosis not present

## 2024-03-02 DIAGNOSIS — M8580 Other specified disorders of bone density and structure, unspecified site: Secondary | ICD-10-CM

## 2024-03-02 DIAGNOSIS — E782 Mixed hyperlipidemia: Secondary | ICD-10-CM | POA: Diagnosis not present

## 2024-03-02 DIAGNOSIS — Z0000 Encounter for general adult medical examination without abnormal findings: Secondary | ICD-10-CM | POA: Diagnosis not present

## 2024-03-02 DIAGNOSIS — I10 Essential (primary) hypertension: Secondary | ICD-10-CM

## 2024-03-02 LAB — COMPREHENSIVE METABOLIC PANEL WITH GFR
ALT: 12 U/L (ref 3–35)
AST: 20 U/L (ref 5–37)
Albumin: 4.8 g/dL (ref 3.5–5.2)
Alkaline Phosphatase: 35 U/L — ABNORMAL LOW (ref 39–117)
BUN: 15 mg/dL (ref 6–23)
CO2: 30 meq/L (ref 19–32)
Calcium: 9.5 mg/dL (ref 8.4–10.5)
Chloride: 95 meq/L — ABNORMAL LOW (ref 96–112)
Creatinine, Ser: 0.71 mg/dL (ref 0.40–1.20)
GFR: 83.39 mL/min
Glucose, Bld: 82 mg/dL (ref 70–99)
Potassium: 4 meq/L (ref 3.5–5.1)
Sodium: 136 meq/L (ref 135–145)
Total Bilirubin: 0.6 mg/dL (ref 0.2–1.2)
Total Protein: 7.5 g/dL (ref 6.0–8.3)

## 2024-03-02 LAB — CBC
HCT: 42.8 % (ref 36.0–46.0)
Hemoglobin: 14.2 g/dL (ref 12.0–15.0)
MCHC: 33.1 g/dL (ref 30.0–36.0)
MCV: 95 fl (ref 78.0–100.0)
Platelets: 322 K/uL (ref 150.0–400.0)
RBC: 4.51 Mil/uL (ref 3.87–5.11)
RDW: 13.7 % (ref 11.5–15.5)
WBC: 5 K/uL (ref 4.0–10.5)

## 2024-03-02 LAB — LIPID PANEL
Cholesterol: 205 mg/dL — ABNORMAL HIGH (ref 28–200)
HDL: 80.4 mg/dL
LDL Cholesterol: 107 mg/dL — ABNORMAL HIGH (ref 10–99)
NonHDL: 124.45
Total CHOL/HDL Ratio: 3
Triglycerides: 87 mg/dL (ref 10.0–149.0)
VLDL: 17.4 mg/dL (ref 0.0–40.0)

## 2024-03-02 NOTE — Assessment & Plan Note (Signed)
 No recurrence locally.

## 2024-03-02 NOTE — Assessment & Plan Note (Signed)
 Due for DEXA 2026 and discussed today. Recommended more exercise to help.

## 2024-03-02 NOTE — Progress Notes (Signed)
" ° °  Subjective:   Patient ID: Elizabeth Vincent, female    DOB: 1950/03/25, 73 y.o.   MRN: [id number]  The patient is here for physical. Pertinent topics discussed: Discussed the use of AI scribe software for clinical note transcription with the patient, who gave verbal consent to proceed.  History of Present Illness Elizabeth Vincent is a 73 year old female with a history of cutaneous T-cell lymphoma (CTCL) who presents for a routine follow-up visit.  She recalls a severe reaction to the colonoscopy prep in the past, which caused significant nausea and vomiting.  PMH, [facility], social history reviewed and updated  Review of Systems  Constitutional: Negative.   HENT: Negative.    Eyes: Negative.   Respiratory:  Negative for cough, chest tightness and shortness of breath.   Cardiovascular:  Negative for chest pain, palpitations and leg swelling.  Gastrointestinal:  Negative for abdominal distention, abdominal pain, constipation, diarrhea, nausea and vomiting.  Musculoskeletal: Negative.   Skin: Negative.   Neurological: Negative.   Psychiatric/Behavioral: Negative.      Objective:  Physical Exam Constitutional:      Appearance: She is well-developed.  HENT:     Head: Normocephalic and atraumatic.  Cardiovascular:     Rate and Rhythm: Normal rate and regular rhythm.  Pulmonary:     Effort: Pulmonary effort is normal. No respiratory distress.     Breath sounds: Normal breath sounds. No wheezing or rales.  Abdominal:     General: Bowel sounds are normal. There is no distension.     Palpations: Abdomen is soft.     Tenderness: There is no abdominal tenderness.  Musculoskeletal:     Cervical back: Normal range of motion.  Skin:    General: Skin is warm and dry.  Neurological:     Mental Status: She is alert and oriented to person, place, and time.     Coordination: Coordination normal.     Vitals:   03/02/24 0914  BP: 130/80  Pulse: 67  Temp: 98.4 F (36.9 C)  TempSrc:  Oral  SpO2: 97%  Weight: 135 lb 9.6 oz (61.5 kg)  Height: 5' 6.5 (1.689 m)    Assessment & Plan:   "

## 2024-03-02 NOTE — Assessment & Plan Note (Signed)
 BP at goal checking lipid panel and CMP. Adjust as needed regimen.

## 2024-03-02 NOTE — Assessment & Plan Note (Signed)
 Checking lipid panel and on pravastatin  adjust as needed.

## 2024-03-02 NOTE — Assessment & Plan Note (Signed)
 Flu shot up to date. Pneumonia complete. Shingrix complete. Tetanus up to date. Colonoscopy up to date. Mammogram up to date, pap smear aged out and dexa due 2026. Counseled about sun safety and mole surveillance. Counseled about the dangers of distracted driving. Given 10 year screening recommendations.

## 2024-03-27 ENCOUNTER — Ambulatory Visit: Payer: PPO

## 2024-03-27 VITALS — BP 128/80 | HR 61 | Ht 66.75 in | Wt 139.0 lb

## 2024-03-27 DIAGNOSIS — Z0000 Encounter for general adult medical examination without abnormal findings: Secondary | ICD-10-CM | POA: Diagnosis not present

## 2024-03-27 NOTE — Progress Notes (Signed)
 "  No chief complaint on file.    Subjective:   Elizabeth Vincent is a 74 y.o. female who presents for a Medicare Annual Wellness Visit.  Visit info / Clinical Intake: Medicare Wellness Visit Type:: Subsequent Annual Wellness Visit Persons participating in visit and providing information:: patient Medicare Wellness Visit Mode:: In-person (required for WTM) Interpreter Needed?: No Pre-visit prep was completed: yes AWV questionnaire completed by patient prior to visit?: yes Date:: 03/25/24 Living arrangements:: (!) (Patient-Rptd) lives alone Patient's Overall Health Status Rating: (Patient-Rptd) good Typical amount of pain: (Patient-Rptd) none Does pain affect daily life?: (Patient-Rptd) no Are you currently prescribed opioids?: no  Dietary Habits and Nutritional Risks How many meals a day?: (Patient-Rptd) 2 Eats fruit and vegetables daily?: (Patient-Rptd) yes Most meals are obtained by: (Patient-Rptd) preparing own meals In the last 2 weeks, have you had any of the following?: none Diabetic:: no  Functional Status Activities of Daily Living (to include ambulation/medication): (Patient-Rptd) Independent Ambulation: Independent with device- listed below Home Assistive Devices/Equipment: Eyeglasses Medication Administration: (Patient-Rptd) Independent Home Management (perform basic housework or laundry): (Patient-Rptd) Independent Manage your own finances?: (Patient-Rptd) yes Primary transportation is: (Patient-Rptd) driving Concerns about vision?: no *vision screening is required for WTM* Concerns about hearing?: no  Fall Screening Falls in the past year?: (Patient-Rptd) 1 Number of falls in past year: (Patient-Rptd) 0 Was there an injury with Fall?: (Patient-Rptd) 0 Fall Risk Category Calculator: (Patient-Rptd) 1 Patient Fall Risk Level: (Patient-Rptd) Low Fall Risk  Fall Risk Patient at Risk for Falls Due to: No Fall Risks Fall risk Follow up: Falls evaluation completed;  Falls prevention discussed  Home and Transportation Safety: All rugs have non-skid backing?: (Patient-Rptd) yes All stairs or steps have railings?: (Patient-Rptd) yes Grab bars in the bathtub or shower?: (Patient-Rptd) yes Have non-skid surface in bathtub or shower?: (Patient-Rptd) yes Good home lighting?: (Patient-Rptd) yes Regular seat belt use?: (Patient-Rptd) yes Hospital stays in the last year:: (Patient-Rptd) no  Cognitive Assessment Difficulty concentrating, remembering, or making decisions? : (Patient-Rptd) no Will 6CIT or Mini Cog be Completed: no 6CIT or Mini Cog Declined: patient alert, oriented, able to answer questions appropriately and recall recent events  Advance Directives (For Healthcare) Does Patient Have a Medical Advance Directive?: Yes Type of Advance Directive: Healthcare Power of [place]; Living will Copy of Healthcare Power of Attorney in Chart?: No - copy requested Copy of Living Will in Chart?: No - copy requested  Reviewed/Updated  Reviewed/Updated: Reviewed All (Medical, Surgical, Family, Medications, Allergies, Care Teams, Patient Goals)    Allergies (verified) Patient has no known allergies.   Current Medications (verified) Outpatient Encounter Medications as of 03/27/2024  Medication Sig   Calcium-Magnesium-Vitamin D (CALCIUM 1200+D3 PO) Take 1 tablet by mouth in the morning and at bedtime.   lisinopril  (ZESTRIL ) 20 MG tablet Take 1 tablet (20 mg total) by mouth daily.   Multiple Vitamins-Minerals (PRESERVISION AREDS 2+MULTI VIT PO) Take 1 tablet by mouth in the morning and at bedtime.   olopatadine (PATADAY) 0.1 % ophthalmic solution Place 1 drop into both eyes daily as needed.   pravastatin  (PRAVACHOL ) 20 MG tablet Take 1 tablet (20 mg total) by mouth daily.   No facility-administered encounter medications on file as of 03/27/2024.    History: Past Medical History:  Diagnosis Date   Cancer (HCC)    T cell Lymphoma   Cataract     bilateral lens implants   Colon polyps    Hyperlipidemia    Hypertension    on  meds   Osteoporosis    Seasonal allergies    Past Surgical History:  Procedure Laterality Date   BREAST BIOPSY     CATARACT EXTRACTION, BILATERAL     COLONOSCOPY  08/29/2020   [facility](good)-hems/tics/piecemeal polyp (2 cecal)-1 yr recall   EYE SURGERY     as a child   SKIN BIOPSY     Family History  Problem Relation Age of Onset   Cancer Mother    Early death Mother    Heart disease Father    Alcohol abuse Sister    Cancer Sister    Cancer Maternal Grandmother    Hearing loss Maternal Grandmother    Cancer Maternal Grandfather    Early death Maternal Grandfather    Heart disease Paternal Grandmother    Cancer Paternal Grandfather    Stroke Paternal Grandfather    Colon polyps Maternal Uncle 78   Colon cancer Maternal Uncle 78   Lung cancer Maternal Uncle    Breast cancer Sister    Arthritis Maternal Aunt    Cancer Maternal Aunt    Miscarriages / Stillbirths Maternal Aunt    Vision loss Maternal Aunt    Cancer Maternal Uncle    Diabetes Maternal Aunt    Hearing loss Maternal Aunt    Vision loss Maternal Aunt    Rectal cancer Neg Hx    Stomach cancer Neg Hx    Esophageal cancer Neg Hx    Social History   Occupational History   Occupation: retired  Tobacco Use   Smoking status: Never   Smokeless tobacco: Never  Vaping Use   Vaping status: Never Used  Substance and Sexual Activity   Alcohol use: Yes    Alcohol/week: 7.0 standard drinks of alcohol    Types: 7 Glasses of wine per week    Comment: wine with dinner   Drug use: Never   Sexual activity: Not Currently   Tobacco Counseling Counseling given: Not Answered  SDOH Screenings   Food Insecurity: No Food Insecurity (03/27/2024)  Housing: Unknown (03/27/2024)  Transportation Needs: No Transportation Needs (03/27/2024)  Utilities: Not At Risk (03/27/2024)  Alcohol Screen: Low Risk (02/29/2024)  Depression (PHQ2-9): Low  Risk (03/27/2024)  Financial Resource Strain: Low Risk (02/29/2024)  Physical Activity: Inactive (03/27/2024)  Social Connections: Moderately Integrated (03/27/2024)  Recent Concern: Social Connections - Moderately Isolated (02/29/2024)  Stress: No Stress Concern Present (03/27/2024)  Tobacco Use: Low Risk (03/27/2024)  Health Literacy: Adequate Health Literacy (03/27/2024)   See flowsheets for full screening details  Depression Screen PHQ 2 & 9 Depression Scale- Over the past 2 weeks, how often have you been bothered by any of the following problems? Little interest or pleasure in doing things: 0 Feeling down, depressed, or hopeless (PHQ Adolescent also includes...irritable): 0 PHQ-2 Total Score: 0 Trouble falling or staying asleep, or sleeping too much: 1 (falling asleep) Feeling tired or having little energy: 0 Poor appetite or overeating (PHQ Adolescent also includes...weight loss): 0 Feeling bad about yourself - or that you are a failure or have let yourself or your family down: 1 (due weight gain) Trouble concentrating on things, such as reading the newspaper or watching television (PHQ Adolescent also includes...like school work): 0 Moving or speaking so slowly that other people could have noticed. Or the opposite - being so fidgety or restless that you have been moving around a lot more than usual: 0 Thoughts that you would be better off dead, or of hurting yourself in some way: 0 PHQ-9 Total  Score: 2 If you checked off any problems, how difficult have these problems made it for you to do your work, take care of things at home, or get along with other people?: Not difficult at all  Depression Treatment Depression Interventions/Treatment : EYV7-0 Score <4 Follow-up Not Indicated     Goals Addressed               This Visit's Progress     Patient Stated (pt-stated)        To get cholesterol under control and lose some weight/get down to 130 lb/2026              Objective:    Today's Vitals   03/27/24 1106  BP: 128/80  Pulse: 61  SpO2: 98%  Weight: 139 lb (63 kg)  Height: 5' 6.75 (1.695 m)   Body mass index is 21.93 kg/m.  Hearing/Vision screen Hearing Screening - Comments:: Denies hearing difficulties   Vision Screening - Comments:: Wears eyeglasses/UTD/ Immunizations and Health Maintenance Health Maintenance  Topic Date Due   Bone Density Scan  06/30/2024   COVID-19 Vaccine (9 - Pfizer risk 2025-26 season) 07/05/2024   Colonoscopy  11/14/2024   Medicare Annual Wellness (AWV)  03/27/2025   DTaP/Tdap/Td (2 - Td or Tdap) 01/30/2028   Pneumococcal Vaccine: 50+ Years  Completed   Influenza Vaccine  Completed   Hepatitis C Screening  Completed   Zoster Vaccines- Shingrix  Completed   Meningococcal B Vaccine  Aged Out   Mammogram  Discontinued        Assessment/Plan:  This is a routine wellness examination for [place].  Patient Care Team: Rollene Almarie LABOR, MD as PCP - General (Internal Medicine) Roz Anes, MD as Consulting Physician (Ophthalmology) Robinson Idol, MD as Consulting Physician (Ophthalmology) Elner Arley LABOR, MD as Consulting Physician (Ophthalmology)  I have personally reviewed and noted the following in the patients chart:   Medical and social history Use of alcohol, tobacco or illicit drugs  Current medications and supplements including opioid prescriptions. Functional ability and status Nutritional status Physical activity Advanced directives List of other physicians Hospitalizations, surgeries, and ER visits in previous 12 months Vitals Screenings to include cognitive, depression, and falls Referrals and appointments  No orders of the defined types were placed in this encounter.  In addition, I have reviewed and discussed with patient certain preventive protocols, quality metrics, and best practice recommendations. A written personalized care plan for preventive services as well as general  preventive health recommendations were provided to patient.   Maigen Mozingo L Farid Grigorian, CMA   03/27/2024   Return in 1 year (on 03/27/2025).  After Visit Summary: (MyChart) Due to this being a telephonic visit, the after visit summary with patients personalized plan was offered to patient via MyChart   Nurse Notes: No voiced or noted concerns at this time. "

## 2024-03-27 NOTE — Patient Instructions (Addendum)
 [name],  Thank you for taking the time for your Medicare Wellness Visit. I appreciate your continued commitment to your health goals. Please review the care plan we discussed, and feel free to reach out if I can assist you further.  Please note that Annual Wellness Visits do not include a physical exam. Some assessments may be limited, especially if the visit was conducted virtually. If needed, we may recommend an in-person follow-up with your provider.  Ongoing Care Seeing your primary care provider every 3 to 6 months helps us  monitor your health and provide consistent, personalized care. Next office visit office 12/30/20026.  Aim for 30 minutes of exercise or brisk walking, 6-8 glasses of water, and 5 servings of fruits and vegetables each day.   Referrals If a referral was made during today's visit and you haven't received any updates within two weeks, please contact the referred provider directly to check on the status.  Recommended Screenings:  Health Maintenance  Topic Date Due   Medicare Annual Wellness Visit  03/26/2024   Osteoporosis screening with Bone Density Scan  06/30/2024   COVID-19 Vaccine (9 - Pfizer risk 2025-26 season) 07/05/2024   Colon Cancer Screening  11/14/2024   DTaP/Tdap/Td vaccine (2 - Td or Tdap) 01/30/2028   Pneumococcal Vaccine for age over 12  Completed   Flu Shot  Completed   Hepatitis C Screening  Completed   Zoster (Shingles) Vaccine  Completed   Meningitis B Vaccine  Aged Out   Breast Cancer Screening  Discontinued       03/25/2024   11:12 AM  Advanced Directives  Does Patient Have a Medical Advance Directive? Yes  Type of Estate Agent of [place];Living will  Copy of Healthcare Power of Attorney in Chart? No - copy requested    Vision: Annual vision screenings are recommended for early detection of glaucoma, cataracts, and diabetic retinopathy. These exams can also reveal signs of chronic conditions such as diabetes  and high blood pressure.  Dental: Annual dental screenings help detect early signs of oral cancer, gum disease, and other conditions linked to overall health, including heart disease and diabetes.  Please see the attached documents for additional preventive care recommendations.

## 2025-03-03 ENCOUNTER — Ambulatory Visit: Admitting: Internal Medicine
